# Patient Record
Sex: Female | Born: 1965 | Race: White | Hispanic: No | Marital: Married | Smoking: Current every day smoker
Health system: Southern US, Community
[De-identification: ages and names within clinical notes are randomized; demographics above are authoritative.]

## PROBLEM LIST (undated history)

## (undated) DIAGNOSIS — E559 Vitamin D deficiency, unspecified: Secondary | ICD-10-CM

## (undated) DIAGNOSIS — I10 Essential (primary) hypertension: Secondary | ICD-10-CM

## (undated) DIAGNOSIS — R7989 Other specified abnormal findings of blood chemistry: Secondary | ICD-10-CM

## (undated) DIAGNOSIS — F32A Depression, unspecified: Secondary | ICD-10-CM

## (undated) DIAGNOSIS — F419 Anxiety disorder, unspecified: Secondary | ICD-10-CM

## (undated) DIAGNOSIS — F329 Major depressive disorder, single episode, unspecified: Secondary | ICD-10-CM

## (undated) DIAGNOSIS — M75100 Unspecified rotator cuff tear or rupture of unspecified shoulder, not specified as traumatic: Secondary | ICD-10-CM

## (undated) DIAGNOSIS — R945 Abnormal results of liver function studies: Secondary | ICD-10-CM

## (undated) DIAGNOSIS — K219 Gastro-esophageal reflux disease without esophagitis: Secondary | ICD-10-CM

## (undated) DIAGNOSIS — I259 Chronic ischemic heart disease, unspecified: Secondary | ICD-10-CM

## (undated) DIAGNOSIS — M6530 Trigger finger, unspecified finger: Secondary | ICD-10-CM

## (undated) DIAGNOSIS — E785 Hyperlipidemia, unspecified: Secondary | ICD-10-CM

## (undated) DIAGNOSIS — J309 Allergic rhinitis, unspecified: Secondary | ICD-10-CM

## (undated) HISTORY — DX: Essential (primary) hypertension: I10

## (undated) HISTORY — DX: Anxiety disorder, unspecified: F41.9

## (undated) HISTORY — DX: Hyperlipidemia, unspecified: E78.5

## (undated) HISTORY — DX: Other specified abnormal findings of blood chemistry: R79.89

## (undated) HISTORY — DX: Allergic rhinitis, unspecified: J30.9

## (undated) HISTORY — DX: Unspecified rotator cuff tear or rupture of unspecified shoulder, not specified as traumatic: M75.100

## (undated) HISTORY — DX: Abnormal results of liver function studies: R94.5

## (undated) HISTORY — DX: Vitamin D deficiency, unspecified: E55.9

## (undated) HISTORY — DX: Depression, unspecified: F32.A

## (undated) HISTORY — DX: Chronic ischemic heart disease, unspecified: I25.9

## (undated) HISTORY — DX: Major depressive disorder, single episode, unspecified: F32.9

## (undated) HISTORY — PX: APPENDECTOMY: SHX54

## (undated) HISTORY — DX: Trigger finger, unspecified finger: M65.30

## (undated) HISTORY — DX: Gastro-esophageal reflux disease without esophagitis: K21.9

---

## 1998-05-07 ENCOUNTER — Other Ambulatory Visit: Admission: RE | Admit: 1998-05-07 | Discharge: 1998-05-07 | Payer: Self-pay | Admitting: Obstetrics & Gynecology

## 1999-12-09 ENCOUNTER — Other Ambulatory Visit: Admission: RE | Admit: 1999-12-09 | Discharge: 1999-12-09 | Payer: Self-pay | Admitting: Obstetrics & Gynecology

## 2002-11-22 ENCOUNTER — Other Ambulatory Visit: Admission: RE | Admit: 2002-11-22 | Discharge: 2002-11-22 | Payer: Self-pay | Admitting: Obstetrics & Gynecology

## 2003-12-01 ENCOUNTER — Other Ambulatory Visit: Admission: RE | Admit: 2003-12-01 | Discharge: 2003-12-01 | Payer: Self-pay | Admitting: Obstetrics & Gynecology

## 2004-03-26 ENCOUNTER — Ambulatory Visit (HOSPITAL_BASED_OUTPATIENT_CLINIC_OR_DEPARTMENT_OTHER): Admission: RE | Admit: 2004-03-26 | Discharge: 2004-03-26 | Payer: Self-pay | Admitting: Urology

## 2006-02-15 ENCOUNTER — Encounter: Admission: RE | Admit: 2006-02-15 | Discharge: 2006-02-15 | Payer: Self-pay | Admitting: Obstetrics & Gynecology

## 2016-01-08 ENCOUNTER — Encounter (INDEPENDENT_AMBULATORY_CARE_PROVIDER_SITE_OTHER): Payer: Self-pay

## 2016-01-08 ENCOUNTER — Encounter (INDEPENDENT_AMBULATORY_CARE_PROVIDER_SITE_OTHER): Payer: Self-pay | Admitting: Orthopaedic Surgery

## 2016-01-08 ENCOUNTER — Ambulatory Visit (INDEPENDENT_AMBULATORY_CARE_PROVIDER_SITE_OTHER): Payer: BC Managed Care – PPO | Admitting: Orthopaedic Surgery

## 2016-01-08 ENCOUNTER — Ambulatory Visit (INDEPENDENT_AMBULATORY_CARE_PROVIDER_SITE_OTHER): Payer: Self-pay

## 2016-01-08 VITALS — BP 167/82 | HR 69 | Ht 64.0 in | Wt 148.0 lb

## 2016-01-08 DIAGNOSIS — M65341 Trigger finger, right ring finger: Secondary | ICD-10-CM

## 2016-01-08 DIAGNOSIS — M25562 Pain in left knee: Secondary | ICD-10-CM

## 2016-01-08 DIAGNOSIS — G8929 Other chronic pain: Secondary | ICD-10-CM

## 2016-01-08 MED ORDER — LIDOCAINE HCL 1 % IJ SOLN
1.0000 mL | INTRAMUSCULAR | Status: AC | PRN
  Administered 2016-01-08: 1 mL

## 2016-01-08 MED ORDER — METHYLPREDNISOLONE ACETATE 40 MG/ML IJ SUSP
20.0000 mg | INTRAMUSCULAR | Status: AC | PRN
  Administered 2016-01-08: 20 mg via INTRA_ARTICULAR

## 2016-01-08 MED ORDER — LIDOCAINE HCL 1 % IJ SOLN
0.5000 mL | INTRAMUSCULAR | Status: AC | PRN
  Administered 2016-01-08: .5 mL

## 2016-01-08 MED ORDER — METHYLPREDNISOLONE ACETATE 40 MG/ML IJ SUSP
40.0000 mg | INTRAMUSCULAR | Status: AC | PRN
  Administered 2016-01-08: 40 mg via INTRA_ARTICULAR

## 2016-01-08 MED ORDER — BUPIVACAINE HCL 0.25 % IJ SOLN
0.6600 mL | INTRAMUSCULAR | Status: AC | PRN
  Administered 2016-01-08: .66 mL via INTRA_ARTICULAR

## 2016-01-08 NOTE — Progress Notes (Signed)
Office Visit Note   Patient: Kathryn Holmes           Date of Birth: 17-Feb-1965           MRN: [id number] Visit Date: 01/08/2016              Requested by: Lucianne LeiNina Uppin, MD [address] [place], [place] [id number] PCP: No primary care provider on file.   Assessment & Plan: Visit Diagnoses:  1. Acute pain of left knee   2. Chronic pain of left knee   3. Trigger finger, right ring finger   .  Plan: Patient had excellent relief for many months from previous injection we repeated the injection her left knee with cortisone to help with the mild synovitis most for symptoms are medial. The right ring finger triggering was treated with the A1 pulley injection. If she has persistent triggering with this she will call and we can set her up for trigger finger release as an outpatient surgery.  Follow-Up Instructions: No Follow-up on file.   Orders:  Orders Placed This Encounter  Procedures  . Large Joint Injection/Arthrocentesis  . Small Joint Injection/Arthrocentesis  . XR Knee 1-2 Views Left   No orders of the defined types were placed in this encounter.     Procedures: Large Joint Inj Date/Time: 01/08/2016 2:52 PM Performed by: Eldred MangesYATES, MARK C Authorized by: Annell GreeningYATES, MARK C   Consent Given by:  Patient Indications:  Pain and joint swelling Location:  Knee Site:  L knee Needle Size:  25 G Needle Length:  1.5 inches Approach:  Anterolateral Ultrasound Guidance: No   Fluoroscopic Guidance: No   Arthrogram: No   Medications:  1 mL lidocaine 1 %; 40 mg methylPREDNISolone acetate 40 MG/ML; 0.66 mL bupivacaine 0.25 % Aspiration Attempted: No   Patient tolerance:  Patient tolerated the procedure well with no immediate complications Small Joint Inj Date/Time: 01/08/2016 2:53 PM Performed by: Eldred MangesYATES, MARK C Authorized by: Annell GreeningYATES, MARK C   Consent Given by:  Patient Indications:  Pain Needle Size:  25 G Spinal Needle: No   Approach:  Volar Ultrasound Guided: No     Fluoroscopic Guidance: No   Medications:  20 mg methylPREDNISolone acetate 40 MG/ML; 0.5 mL lidocaine 1 % Patient tolerance:  Patient tolerated the procedure well with no immediate complications     Clinical Data: No additional findings.   Subjective: Chief Complaint  Patient presents with  . Right Hand - Pain  . Left Knee - Pain    Patient states right ring finger pain for about 3 months, Pain radiates into right hand.  Had some swelling in right ring finger and soreness.  Left knee pain for about 1 year, states she bent down at work and felt a pop.  Seen PCP, had x-rays taken, pops and gives out at times when walking.  Patient states her left knee rigidly she has pain down and And suddenly her knee popped and she setting persistent pain since then she had an injection done by her primary care physician into her knee in about April of this year. She states she was better for. Time and then after about 6 months she started having increasing pain in her knee and catching once again. States her knee actually gives way sometimes when she is walking. Going up or down steps or an area with particularly bothers her.  Review of Systems  Constitutional: Negative for chills and diaphoresis.  HENT: Negative for ear discharge,  ear pain and nosebleeds.   Eyes: Negative for discharge and visual disturbance.  Respiratory: Negative for cough, choking and shortness of breath.        Patient is a smoker  Cardiovascular: Negative for chest pain and palpitations.       History of cardiomyopathy, patient states she was told is related to stress  Gastrointestinal: Negative for abdominal distention and abdominal pain.  Endocrine: Negative for cold intolerance and heat intolerance.  Genitourinary: Negative for flank pain and hematuria.  Skin: Negative for rash and wound.  Neurological: Negative for seizures and speech difficulty.  Hematological: Negative for adenopathy. Does not bruise/bleed easily.   Psychiatric/Behavioral: Negative for agitation and suicidal ideas.     Objective: Vital Signs: BP (!) 167/82   Pulse 69   Ht 5\' 4"  (1.626 m)   Wt 148 lb (67.1 kg)   BMI 25.40 kg/m   Physical Exam  Constitutional: She is oriented to person, place, and time. She appears well-developed.  HENT:  Head: Normocephalic.  Right Ear: External ear normal.  Left Ear: External ear normal.  Eyes: Pupils are equal, round, and reactive to light.  Neck: No tracheal deviation present. No thyromegaly present.  Cardiovascular: Normal rate.   Pulmonary/Chest: Effort normal.  Abdominal: Soft.  Musculoskeletal:  Patient has pain with patellar compression difficulty reaching full extension with squatting she has sharp pain with a pop in her knee. Opposite right knee also has some popping with the resisted extension. Collateral ligaments are stable mild synovitis left knee. Positive patellofemoral grind test medial joint line tenderness anterior cruciate ligament PCL exam is normal negative Lachman and negative pivot shift distal pulses are 2+.  Neurological: She is alert and oriented to person, place, and time.  Skin: Skin is warm and dry.  Psychiatric: She has a normal mood and affect. Her behavior is normal.    Ortho Exam Patient has acute triggering with palpable nodule flexor tendon at the A1 pulley right ring finger. No other fingers are triggering. Joints of the wrist and digits are normal. Specialty Comments:  No specialty comments available.  Imaging: Xr Knee 1-2 Views Left  Result Date: 01/08/2016 Standing AP x-ray and lateral left knee obtained. This shows mild irregularity on the patellar articular surface. No joint space narrowing in standing position.    PMFS History: There are no active problems to display for this patient.  No past medical history on file.  No family history on file.  No past surgical history on file. Social History   Occupational History  . Not on file.    Social History Main Topics  . Smoking status: Current Every Day Smoker  . Smokeless tobacco: Never Used  . Alcohol use Not on file  . Drug use: Unknown  . Sexual activity: Not on file

## 2016-02-10 ENCOUNTER — Ambulatory Visit (INDEPENDENT_AMBULATORY_CARE_PROVIDER_SITE_OTHER): Payer: BC Managed Care – PPO | Admitting: Orthopaedic Surgery

## 2016-03-15 ENCOUNTER — Ambulatory Visit (INDEPENDENT_AMBULATORY_CARE_PROVIDER_SITE_OTHER): Payer: BC Managed Care – PPO | Admitting: Orthopaedic Surgery

## 2016-03-16 ENCOUNTER — Ambulatory Visit (INDEPENDENT_AMBULATORY_CARE_PROVIDER_SITE_OTHER): Payer: BC Managed Care – PPO | Admitting: Orthopaedic Surgery

## 2016-03-16 ENCOUNTER — Encounter (INDEPENDENT_AMBULATORY_CARE_PROVIDER_SITE_OTHER): Payer: Self-pay | Admitting: Orthopaedic Surgery

## 2016-03-16 VITALS — BP 154/81 | HR 59 | Ht 63.0 in | Wt 147.0 lb

## 2016-03-16 DIAGNOSIS — M25562 Pain in left knee: Secondary | ICD-10-CM

## 2016-03-16 DIAGNOSIS — G8929 Other chronic pain: Secondary | ICD-10-CM | POA: Diagnosis not present

## 2016-03-16 NOTE — Progress Notes (Signed)
   Office Visit Note   Patient: Ashok CordiaSandra C Hitson           Date of Birth: [birth year]           MRN: [id number] Visit Date: 03/16/2016              Requested by: No referring provider defined for this encounter. PCP: No primary care provider on file.   Assessment & Plan: Visit Diagnoses:  1. Chronic pain of left knee   and mechanical symptoms  Plan: For ongoing left knee pain and mechanical symptoms we'll schedule MRI to rule out medial meniscus tear.  Follow-up after completion to discuss results. Failed conservative treatment with injections, oral NSAIDs, home exercise program.    Follow-Up Instructions: Return in about 3 weeks (around 04/06/2016) for Return for review of left knee MRI scan.   Orders:  Orders Placed This Encounter  Procedures  . MR Knee Left w/o contrast   No orders of the defined types were placed in this encounter.     Procedures: No procedures performed   Clinical Data: No additional findings.   Subjective: Chief Complaint  Patient presents with  . Left Knee - Pain    Patient returns with left knee pain. She had a left knee injection on 01/08/2016. She states that the injection only lasted a few days. She states the knee is uncomfortable to walk or apply pressure. The pain is waking her up at night. She states that it "feels like something is electrocuting in there".  She also had right trigger finger injection on 01/08/2016.  She states the finger is better.   She describes ongoing medial knee pain with feeling of locking and catching.  recent injection  helped for a couple days.  Review of Systems  Respiratory: Negative.   Cardiovascular: Negative.   Musculoskeletal:       Left knee pain  Neurological: Negative.   Psychiatric/Behavioral: Negative.      Objective: Vital Signs: BP (!) 154/81   Pulse (!) 59   Ht 5\' 3"  (1.6 m)   Wt 147 lb (66.7 kg)   BMI 26.04 kg/m   Physical Exam  Constitutional: She is oriented to person, place,  and time. She appears well-developed.  HENT:  Head: Normocephalic.  Pulmonary/Chest: No respiratory distress.  Musculoskeletal: Normal range of motion.  Left knee she has good range of motion. No palpable effusion. Medial joint line tender. Positive McMurray's test. Cruciate and collateral ligament was stable.  Neurological: She is alert and oriented to person, place, and time.  Skin: Skin is warm.  Psychiatric: She has a normal mood and affect.    Ortho Exam  Specialty Comments:  No specialty comments available.  Imaging: No results found.   PMFS History: There are no active problems to display for this patient.  No past medical history on file.  No family history on file.  No past surgical history on file. Social History   Occupational History  . Not on file.   Social History Main Topics  . Smoking status: Current Every Day Smoker  . Smokeless tobacco: Never Used  . Alcohol use Not on file  . Drug use: Unknown  . Sexual activity: Not on file

## 2016-03-18 ENCOUNTER — Telehealth (INDEPENDENT_AMBULATORY_CARE_PROVIDER_SITE_OTHER): Payer: Self-pay | Admitting: *Deleted

## 2016-03-18 NOTE — Telephone Encounter (Signed)
Pt called back and aware of appt 

## 2016-03-18 NOTE — Telephone Encounter (Signed)
[facility] at 5[facility] for MRI, pt needs to arrive 15 mins early for appt, Lmtrc to pt for appt information.

## 2016-03-22 ENCOUNTER — Ambulatory Visit (HOSPITAL_COMMUNITY)
Admission: RE | Admit: 2016-03-22 | Discharge: 2016-03-22 | Disposition: A | Discharge status: Home / Self Care | Payer: BC Managed Care – PPO | Source: Ambulatory Visit | Attending: Surgery | Admitting: Surgery

## 2016-03-22 ENCOUNTER — Encounter (HOSPITAL_COMMUNITY): Payer: Self-pay | Admitting: Radiology

## 2016-03-22 DIAGNOSIS — M25562 Pain in left knee: Secondary | ICD-10-CM | POA: Insufficient documentation

## 2016-03-22 DIAGNOSIS — G8929 Other chronic pain: Secondary | ICD-10-CM | POA: Diagnosis present

## 2016-03-22 DIAGNOSIS — M25462 Effusion, left knee: Secondary | ICD-10-CM | POA: Insufficient documentation

## 2016-04-19 ENCOUNTER — Encounter (INDEPENDENT_AMBULATORY_CARE_PROVIDER_SITE_OTHER): Payer: Self-pay | Admitting: Orthopaedic Surgery

## 2016-04-19 ENCOUNTER — Ambulatory Visit (INDEPENDENT_AMBULATORY_CARE_PROVIDER_SITE_OTHER): Payer: BC Managed Care – PPO | Admitting: Orthopaedic Surgery

## 2016-04-19 VITALS — BP 146/82 | HR 70 | Ht 63.0 in | Wt 147.0 lb

## 2016-04-19 DIAGNOSIS — M7542 Impingement syndrome of left shoulder: Secondary | ICD-10-CM | POA: Diagnosis not present

## 2016-04-19 DIAGNOSIS — M23204 Derangement of unspecified medial meniscus due to old tear or injury, left knee: Secondary | ICD-10-CM | POA: Diagnosis not present

## 2016-04-19 NOTE — Progress Notes (Signed)
Office Visit Note   Patient: Kathryn CordiaSandra C Schembri           Date of Birth: 01/27/1965           MRN: [id number] Visit Date: 04/19/2016              Requested by: No referring provider defined for this encounter. PCP: Lucianne LeiUPPIN,NINA, MD   Assessment & Plan: Visit Diagnoses:  1. Impingement syndrome of left shoulder   2. Degenerative tear of medial meniscus of left knee     Plan: MRI scan shows a degenerative posterior medial meniscus. She has no symptoms consistent with iliotibial band where she also has a little bit of edema. This may be related from previous cortisone injection she had an anemia at the lateral side. The posterior medial meniscus shows some degeneration but no definite tear. She can continues to have ongoing problems with her knee will set up for some physical therapy as well as for her left shoulder which bothers her  Follow-Up Instructions: Return in about 1 month (around 05/20/2016).   Orders:  No orders of the defined types were placed in this encounter.  No orders of the defined types were placed in this encounter.     Procedures: No procedures performed   Clinical Data: No additional findings.   Subjective: Chief Complaint  Patient presents with  . Left Knee - Pain, Follow-up    Patient returns to review MRI left knee. She states there have been no changes. She continues to have left knee pain.    Review of Systems 14 point review of systems updated unchanged from 03/16/2016 office visit.  Objective: Vital Signs: BP (!) 146/82   Pulse 70   Ht 5\' 3"  (1.6 m)   Wt 147 lb (66.7 kg)   BMI 26.04 kg/m   Physical Exam  Constitutional: She is oriented to person, place, and time. She appears well-developed.  HENT:  Head: Normocephalic.  Right Ear: External ear normal.  Left Ear: External ear normal.  Eyes: Pupils are equal, round, and reactive to light.  Neck: No tracheal deviation present. No thyromegaly present.  Cardiovascular: Normal rate.     Pulmonary/Chest: Effort normal.  Abdominal: Soft.  Musculoskeletal:  Patient has discomfort with the outstretched reaching left arm. Positive impingement left arm negative drop arm test but she has some discomfort with empty can test and resisted supraspinatus testing lung of the biceps is normal negative ureter sent. No by complex tenderness. Reflexes are 2+ and symmetrical. No knee effusion noted. No tenderness laterally iliotibial band is normal collateral crucial ligament exam is normal. She is tender along the medial joint line left knee primarily posteriorly. No clicking or Baker's cyst tib-fib joint is normal. No synovitis of noted in the upper extremities. Knees reach full extension negative straight-leg raising. Mild sciatic notch tenderness on the left side and none on the right. Trochanteric bursa is nontender.  Neurological: She is alert and oriented to person, place, and time.  Skin: Skin is warm and dry.  Psychiatric: She has a normal mood and affect. Her behavior is normal.    Ortho Exam  Specialty Comments:  No specialty comments available.  Imaging: No results found.   PMFS History: There are no active problems to display for this patient.  No past medical history on file.  No family history on file.  No past surgical history on file. Social History   Occupational History  . Not on file.   Social History Main  Topics  . Smoking status: Current Every Day Smoker  . Smokeless tobacco: Never Used  . Alcohol use Not on file  . Drug use: Unknown  . Sexual activity: Not on file

## 2016-05-24 ENCOUNTER — Ambulatory Visit (INDEPENDENT_AMBULATORY_CARE_PROVIDER_SITE_OTHER): Payer: BC Managed Care – PPO | Admitting: Orthopaedic Surgery

## 2016-11-23 ENCOUNTER — Ambulatory Visit: Payer: BC Managed Care – PPO | Admitting: Cardiology

## 2016-12-14 ENCOUNTER — Ambulatory Visit: Payer: BC Managed Care – PPO | Admitting: Cardiology

## 2016-12-23 ENCOUNTER — Encounter: Payer: Self-pay | Admitting: Cardiology

## 2016-12-23 ENCOUNTER — Encounter: Payer: Self-pay | Admitting: *Deleted

## 2016-12-23 ENCOUNTER — Other Ambulatory Visit: Payer: Self-pay | Admitting: *Deleted

## 2016-12-23 DIAGNOSIS — R7989 Other specified abnormal findings of blood chemistry: Secondary | ICD-10-CM | POA: Insufficient documentation

## 2016-12-23 DIAGNOSIS — E785 Hyperlipidemia, unspecified: Secondary | ICD-10-CM

## 2016-12-23 DIAGNOSIS — R945 Abnormal results of liver function studies: Secondary | ICD-10-CM

## 2016-12-23 DIAGNOSIS — I10 Essential (primary) hypertension: Secondary | ICD-10-CM | POA: Insufficient documentation

## 2016-12-23 DIAGNOSIS — I259 Chronic ischemic heart disease, unspecified: Secondary | ICD-10-CM | POA: Insufficient documentation

## 2016-12-23 DIAGNOSIS — E559 Vitamin D deficiency, unspecified: Secondary | ICD-10-CM | POA: Insufficient documentation

## 2016-12-23 DIAGNOSIS — K219 Gastro-esophageal reflux disease without esophagitis: Secondary | ICD-10-CM | POA: Insufficient documentation

## 2016-12-23 HISTORY — DX: Hyperlipidemia, unspecified: E78.5

## 2016-12-23 HISTORY — DX: Gastro-esophageal reflux disease without esophagitis: K21.9

## 2016-12-23 HISTORY — DX: Vitamin D deficiency, unspecified: E55.9

## 2016-12-26 ENCOUNTER — Ambulatory Visit (INDEPENDENT_AMBULATORY_CARE_PROVIDER_SITE_OTHER): Payer: Worker's Compensation | Admitting: Cardiology

## 2016-12-26 ENCOUNTER — Encounter (INDEPENDENT_AMBULATORY_CARE_PROVIDER_SITE_OTHER): Payer: Self-pay

## 2016-12-26 ENCOUNTER — Encounter: Payer: Self-pay | Admitting: Cardiology

## 2016-12-26 VITALS — BP 140/80 | HR 57 | Ht 63.0 in | Wt 156.0 lb

## 2016-12-26 DIAGNOSIS — I10 Essential (primary) hypertension: Secondary | ICD-10-CM | POA: Insufficient documentation

## 2016-12-26 DIAGNOSIS — F17210 Nicotine dependence, cigarettes, uncomplicated: Secondary | ICD-10-CM

## 2016-12-26 DIAGNOSIS — E782 Mixed hyperlipidemia: Secondary | ICD-10-CM | POA: Diagnosis not present

## 2016-12-26 DIAGNOSIS — Z8679 Personal history of other diseases of the circulatory system: Secondary | ICD-10-CM

## 2016-12-26 DIAGNOSIS — Z01810 Encounter for preprocedural cardiovascular examination: Secondary | ICD-10-CM | POA: Diagnosis not present

## 2016-12-26 HISTORY — DX: Essential (primary) hypertension: I10

## 2016-12-26 HISTORY — DX: Encounter for preprocedural cardiovascular examination: Z01.810

## 2016-12-26 HISTORY — DX: Nicotine dependence, cigarettes, uncomplicated: F17.210

## 2016-12-26 HISTORY — DX: Personal history of other diseases of the circulatory system: Z86.79

## 2016-12-26 NOTE — Addendum Note (Signed)
Addended by: Craige CottaANDERSON, Ellasyn Swilling S on: 12/26/2016 09:45 AM   Modules accepted: Orders

## 2016-12-26 NOTE — Progress Notes (Signed)
Cardiology Office Note:    Date:  12/26/2016   ID:  Kathryn Holmes, DOB 12-26-1965, MRN [id number]  PCP:  Lucianne LeiUppin, Nina, MD  Cardiologist:  Garwin Brothersajan R Jenavi Beedle, MD   Referring MD: Lucianne LeiUppin, Nina, MD    ASSESSMENT:    1. Preoperative cardiovascular examination   2. Essential hypertension   3. Mixed hyperlipidemia   4. History of cardiomyopathy   5. Continuous dependence on cigarette smoking    PLAN:    In order of problems listed above:  1. Secondary prevention stressed to the patient.  Importance of compliance with diet and medication stressed and she vocalized understanding. 2. Her blood pressure is stable and her lipids are followed by her primary care physician. 3. She leads a sedentary lifestyle.  She has multiple risk factors for coronary artery disease.  She will undergo echocardiogram testing to assess left ventricular systolic function in view of cardiomyopathy.  Also she has multiple risk factors for coronary artery disease so we will do a Lexiscan sestamibi.  If this is negative then she has not at high risk for coronary events during the aforementioned surgery.  Meticulous hemodynamic monitoring and continued perioperative beta-blockade will further reduce the risk of coronary events.  I spent 5 minutes with the patient discussing solely about smoking. Smoking cessation was counseled. I suggested to the patient also different medications and pharmacological interventions. Patient is keen to try stopping on its own at this time. He will get back to me if he needs any further assistance in this matter.   Medication Adjustments/Labs and Tests Ordered: Current medicines are reviewed at length with the patient today.  Concerns regarding medicines are outlined above.  No orders of the defined types were placed in this encounter.  No orders of the defined types were placed in this encounter.    History of Present Illness:    Kathryn Holmes is a 51 y.o. female who is being seen  today for the evaluation of preop cardiovascular risk stratification at the request of Lucianne LeiUppin, Nina, MD.  Patient is a pleasant 51 year old female.  This patient has been under my care in my previous practice.  He is here now to transfer his care and be established with my current practice.  She has past medical history of essential hypertension and dyslipidemia.  She mentions to me that about 5 years ago she has undergone coronary angiography and was told that her coronaries were unremarkable.  At that time she had history of cardiomyopathy.  Subsequently in her history she mentions to me that last year she [facility] and underwent stress testing which was unremarkable.  She leads a sedentary lifestyle.  She works as a Arboriculturistcustodian.  She denies any chest pain but has shortness of breath.  No orthopnea or PND.  At the time of my evaluation she is alert awake oriented and in no distress.  She denies any history of syncope.  Her shortness of breath is fairly steady.  Unfortunately she continues to smoke and is considering quitting by the end of the.  Past Medical History:  Diagnosis Date  . Allergic rhinitis   . Anxiety and depression   . Elevated LFTs   . GERD (gastroesophageal reflux disease) 12/23/2016  . Hyperlipidemia 12/23/2016  . Hypertension   . Ischemic heart disease   . Rotator cuff tear    Left shoulder  . Trigger finger   . Vitamin D deficiency 12/23/2016    Past Surgical History:  Procedure Laterality Date  . APPENDECTOMY      Current Medications: Current Meds  Medication Sig  . aspirin EC 81 MG tablet Take 81 mg by mouth daily.  Marland Kitchen. atorvastatin (LIPITOR) 80 MG tablet   . buPROPion (WELLBUTRIN XL) 150 MG 24 hr tablet   . fluticasone (FLONASE) 50 MCG/ACT nasal spray   . lisinopril-hydrochlorothiazide (PRINZIDE,ZESTORETIC) 10-12.5 MG tablet   . metoprolol succinate (TOPROL-XL) 50 MG 24 hr tablet daily.  . montelukast (SINGULAIR) 10 MG tablet   . MYRBETRIQ 50  MG TB24 tablet daily.  . nitroGLYCERIN (NITROSTAT) 0.4 MG SL tablet Place 0.4 mg under the tongue every 5 (five) minutes as needed for chest pain.  . Omega-3 Fatty Acids (FISH OIL) 1000 MG CAPS Take 1,000 mg by mouth 2 (two) times daily.  Marland Kitchen. omeprazole (PRILOSEC) 40 MG capsule   . PROAIR HFA 108 (90 Base) MCG/ACT inhaler   . sertraline (ZOLOFT) 100 MG tablet   . Vitamin D, Ergocalciferol, (DRISDOL) 50000 units CAPS capsule      Allergies:   Patient has no known allergies.   Social History   Socioeconomic History  . Marital status: Single    Spouse name: None  . Number of children: None  . Years of education: None  . Highest education level: None  Social Needs  . Financial resource strain: None  . Food insecurity - worry: None  . Food insecurity - inability: None  . Transportation needs - medical: None  . Transportation needs - non-medical: None  Occupational History  . None  Tobacco Use  . Smoking status: Current Every Day Smoker  . Smokeless tobacco: Never Used  Substance and Sexual Activity  . Alcohol use: No    Frequency: Never  . Drug use: No  . Sexual activity: None  Other Topics Concern  . None  Social History Narrative  . None     Family History: The patient's family history includes Heart attack in her father; Hypercholesterolemia in her father; Hypertension in her brother, mother, and sister.  ROS:   Please see the history of present illness.    All other systems reviewed and are negative.  EKGs/Labs/Other Studies Reviewed:    The following studies were reviewed today: I discussed the findings of lab work and office records sent by the primary care physician at length   Recent Labs: No results found for requested labs within last 8760 hours.  Recent Lipid Panel No results found for: CHOL, TRIG, HDL, CHOLHDL, VLDL, LDLCALC, LDLDIRECT  Physical Exam:    VS:  BP 140/80 (BP Location: Right Arm, Patient Position: Sitting, Cuff Size: Normal)   Pulse (!)  57   Ht 5\' 3"  (1.6 m)   Wt 156 lb (70.8 kg)   SpO2 98%   BMI 27.63 kg/m     Wt Readings from Last 3 Encounters:  12/26/16 156 lb (70.8 kg)  04/19/16 147 lb (66.7 kg)  03/16/16 147 lb (66.7 kg)     GEN: Patient is in no acute distress HEENT: Normal NECK: No JVD; No carotid bruits LYMPHATICS: No lymphadenopathy CARDIAC: S1 S2 regular, 2/6 systolic murmur at the apex. RESPIRATORY:  Clear to auscultation without rales, wheezing or rhonchi  ABDOMEN: Soft, non-tender, non-distended MUSCULOSKELETAL:  No edema; No deformity  SKIN: Warm and dry NEUROLOGIC:  Alert and oriented x 3 PSYCHIATRIC:  Normal affect    Signed, Garwin Brothersajan R Cannie Muckle, MD  12/26/2016 9:37 AM    [facility]

## 2016-12-26 NOTE — Patient Instructions (Signed)
Medication Instructions:  Your physician recommends that you continue on your current medications as directed. Please refer to the Current Medication list given to you today.  Labwork: None  Testing/Procedures: Your physician has requested that you have an echocardiogram. Echocardiography is a painless test that uses sound waves to create images of your heart. It provides your doctor with information about the size and shape of your heart and how well your heart's chambers and valves are working. This procedure takes approximately one hour. There are no restrictions for this procedure.  Your physician has requested that you have a lexiscan myoview. For further information please visit [email or web address]. Please follow instruction sheet, as given.   Follow-Up: Your physician recommends that you schedule a follow-up appointment in: 3 months   Any Other Special Instructions Will Be Listed Below (If Applicable).     If you need a refill on your cardiac medications before your next appointment, please call your pharmacy.   [facility]  Garey HamAshley A, RN, BSN

## 2017-02-20 ENCOUNTER — Telehealth: Payer: Self-pay | Admitting: Cardiology

## 2017-02-20 NOTE — Telephone Encounter (Signed)
About an appt she's supposed to be set up for

## 2017-02-20 NOTE — Telephone Encounter (Signed)
Patient was inquiring if we had received any workers comp papers; as of late I have not.

## 2017-02-28 ENCOUNTER — Telehealth: Payer: Self-pay | Admitting: Cardiology

## 2017-02-28 NOTE — Telephone Encounter (Signed)
Informed patient that the documents had been received and that Bjorn LoserRhonda will be receiving the information soon.

## 2017-02-28 NOTE — Telephone Encounter (Signed)
Patient wants to know if work comp has sent in paperwork to doctor

## 2017-03-06 ENCOUNTER — Telehealth: Payer: Self-pay | Admitting: Cardiology

## 2017-03-06 NOTE — Telephone Encounter (Signed)
Will discuss with Charmaine to see if this has been resolved with pre-certing.

## 2017-03-06 NOTE — Telephone Encounter (Signed)
Has questions about something that is supposed to be scheduled for her

## 2017-03-10 ENCOUNTER — Other Ambulatory Visit: Payer: Self-pay

## 2017-03-10 DIAGNOSIS — Z01818 Encounter for other preprocedural examination: Secondary | ICD-10-CM

## 2017-03-10 NOTE — Telephone Encounter (Signed)
Informed patient of her appointments date and time.

## 2017-03-14 ENCOUNTER — Telehealth (HOSPITAL_COMMUNITY): Payer: Self-pay | Admitting: *Deleted

## 2017-03-14 NOTE — Telephone Encounter (Signed)
Left message on voicemail per DPR in reference to upcoming appointment scheduled on 03/17/17 at 1100 with detailed instructions given per Myocardial Perfusion Study Information Sheet for the test. LM to arrive 15 minutes early, and that it is imperative to arrive on time for appointment to keep from having the test rescheduled. If you need to cancel or reschedule your appointment, please call the office within 24 hours of your appointment. Failure to do so may result in a cancellation of your appointment, and a $50 no show fee. Phone number given for call back for any questions.

## 2017-03-17 ENCOUNTER — Encounter (INDEPENDENT_AMBULATORY_CARE_PROVIDER_SITE_OTHER): Payer: Self-pay

## 2017-03-17 ENCOUNTER — Other Ambulatory Visit: Payer: Self-pay

## 2017-03-17 ENCOUNTER — Ambulatory Visit (HOSPITAL_COMMUNITY): Payer: No Typology Code available for payment source | Attending: Cardiovascular Disease

## 2017-03-17 ENCOUNTER — Ambulatory Visit (HOSPITAL_COMMUNITY): Payer: Worker's Compensation | Attending: Cardiology

## 2017-03-17 DIAGNOSIS — E785 Hyperlipidemia, unspecified: Secondary | ICD-10-CM | POA: Diagnosis not present

## 2017-03-17 DIAGNOSIS — I517 Cardiomegaly: Secondary | ICD-10-CM | POA: Insufficient documentation

## 2017-03-17 DIAGNOSIS — I10 Essential (primary) hypertension: Secondary | ICD-10-CM | POA: Diagnosis not present

## 2017-03-17 DIAGNOSIS — Z01818 Encounter for other preprocedural examination: Secondary | ICD-10-CM | POA: Insufficient documentation

## 2017-03-17 LAB — MYOCARDIAL PERFUSION IMAGING
CHL CUP NUCLEAR SDS: 3
CHL CUP NUCLEAR SRS: 0
CHL CUP NUCLEAR SSS: 3
CHL CUP RESTING HR STRESS: 64 {beats}/min
LHR: 0.4
LV dias vol: 84 mL (ref 46–106)
LV sys vol: 20 mL
NUC STRESS TID: 1.12
Peak HR: 90 {beats}/min

## 2017-03-17 MED ORDER — TECHNETIUM TC 99M TETROFOSMIN IV KIT
32.4000 | PACK | Freq: Once | INTRAVENOUS | Status: AC | PRN
  Administered 2017-03-17: 32.4 via INTRAVENOUS
  Filled 2017-03-17: qty 33

## 2017-03-17 MED ORDER — TECHNETIUM TC 99M TETROFOSMIN IV KIT
10.1000 | PACK | Freq: Once | INTRAVENOUS | Status: AC | PRN
  Administered 2017-03-17: 10.1 via INTRAVENOUS
  Filled 2017-03-17: qty 11

## 2017-03-17 MED ORDER — REGADENOSON 0.4 MG/5ML IV SOLN
0.4000 mg | Freq: Once | INTRAVENOUS | Status: AC
  Administered 2017-03-17: 0.4 mg via INTRAVENOUS

## 2017-04-20 DIAGNOSIS — Z01818 Encounter for other preprocedural examination: Secondary | ICD-10-CM | POA: Diagnosis not present

## 2017-09-02 IMAGING — MR MR KNEE*L* W/O CM
4 of 6 series · 19 of 40 positions shown · non-contrast
Comparison: 07/15/2014

CLINICAL DATA: Chronic left knee pain.  Swelling.

EXAM:
MRI OF THE LEFT KNEE WITHOUT CONTRAST
TECHNIQUE: Multiplanar, multisequence MR imaging of the knee was performed. No
intravenous contrast was administered.

[Series 4: PD fat-sat · axial · 4.0mm · 0.29mm/px · z∈[-113,+6]mm · 8 of 25 slices shown (1 of 4)]
[im 1/25]
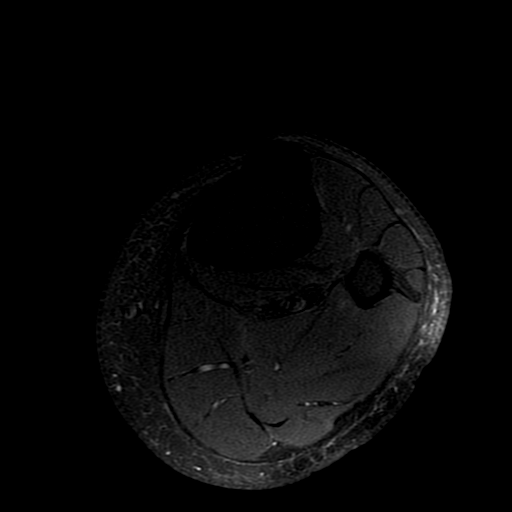
[im 4/25]
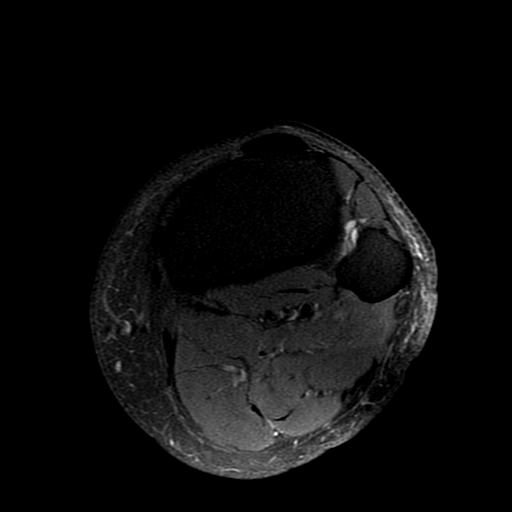
[im 7/25]
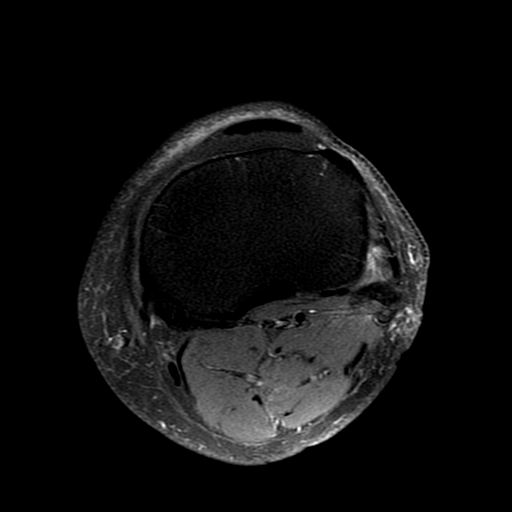
[im 11/25]
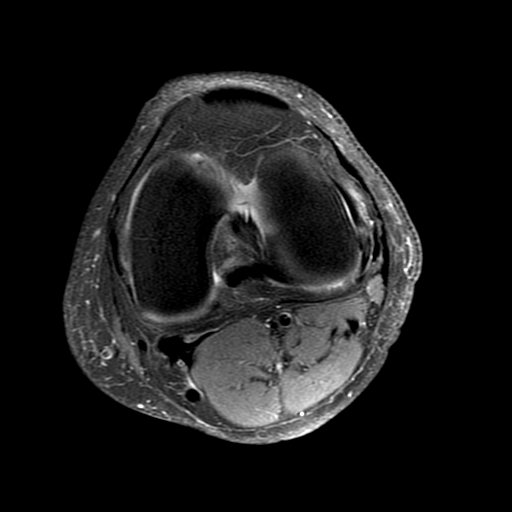
[im 14/25]
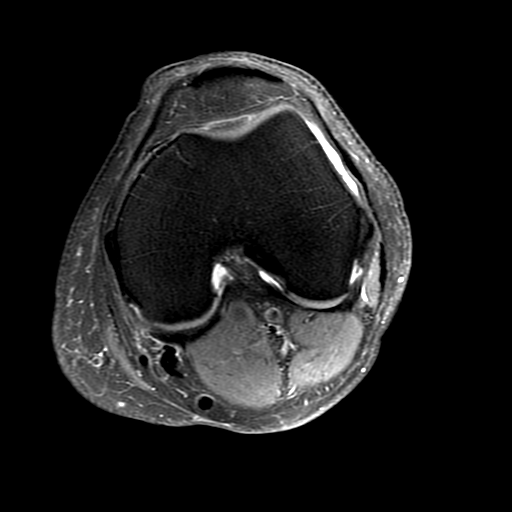
[im 18/25]
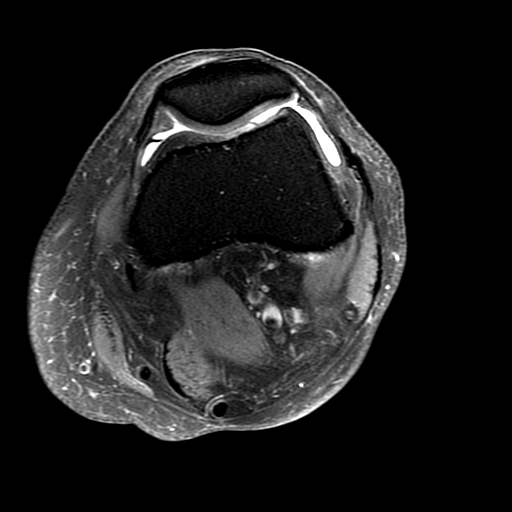
[im 21/25]
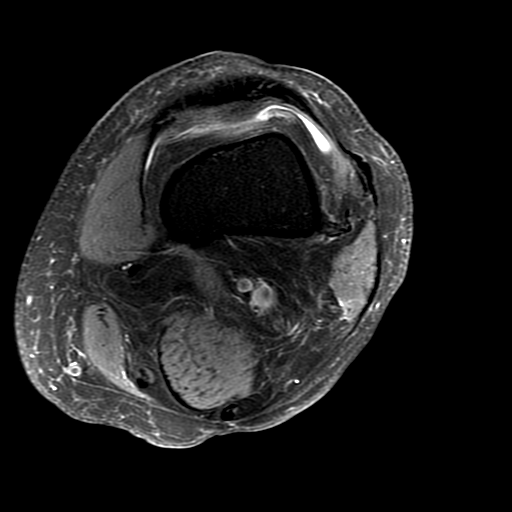
[im 25/25]
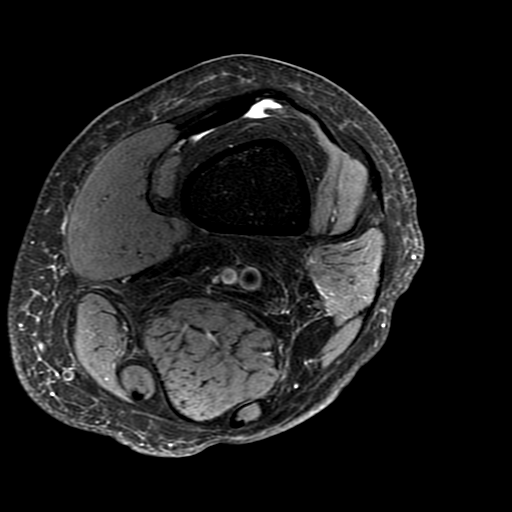

[Series 5: PD fat-sat · coronal · 4.0mm · 0.31mm/px · 5 of 23 slices shown (2 of 4)]
[im 1/23]
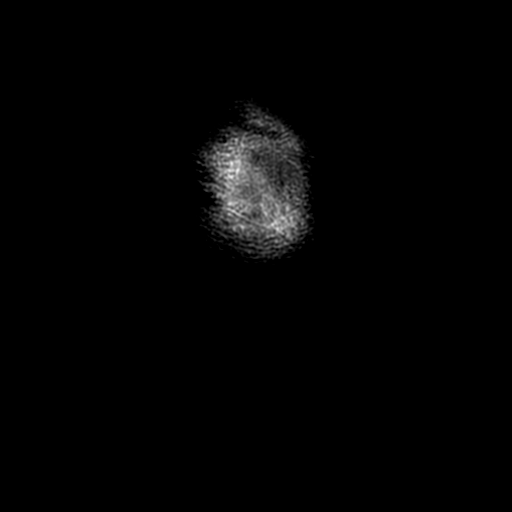
[im 4/23]
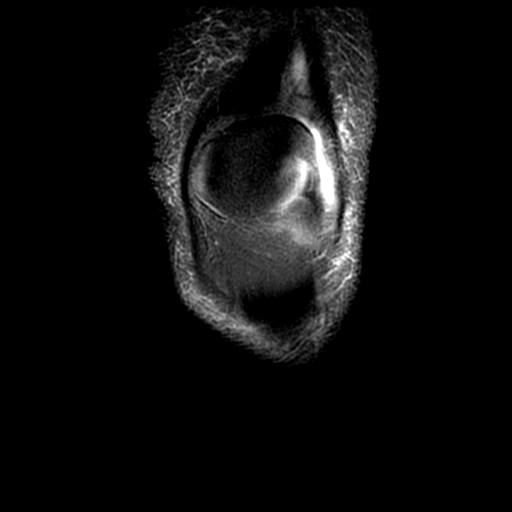
[im 8/23]
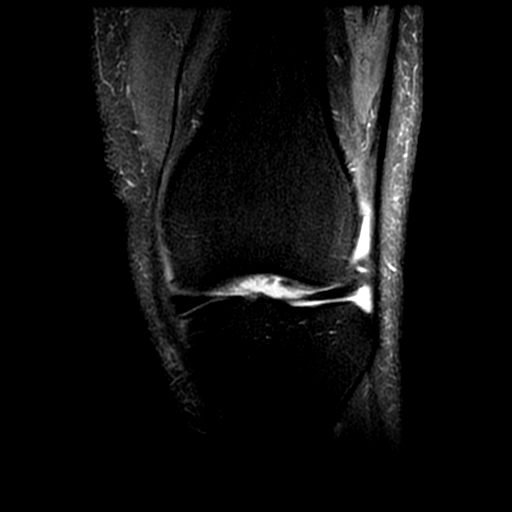
[im 12/23]
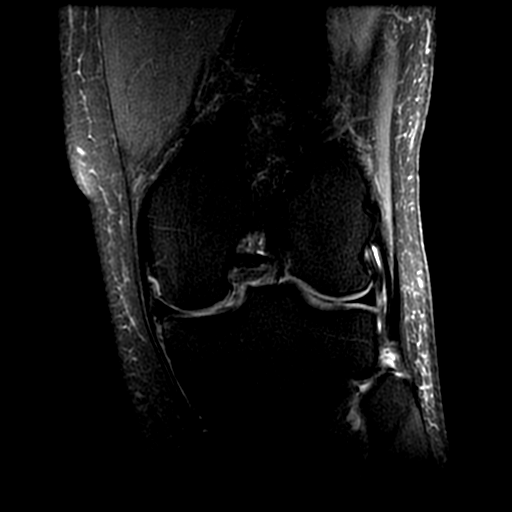
[im 19/23]
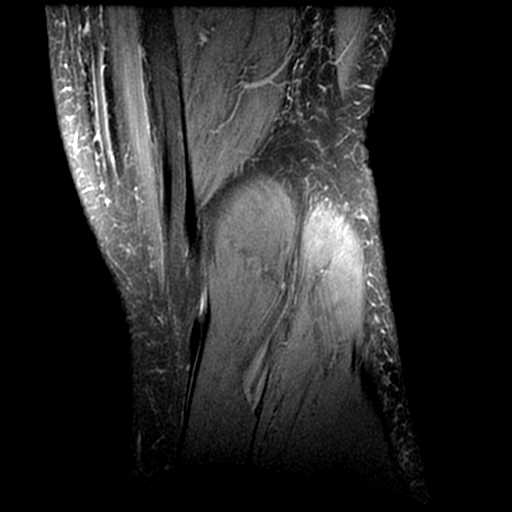

[Series 7: PD fat-sat · sagittal · 4.0mm · 0.31mm/px · 3 of 22 slices shown (3 of 4)]
[im 4/22]
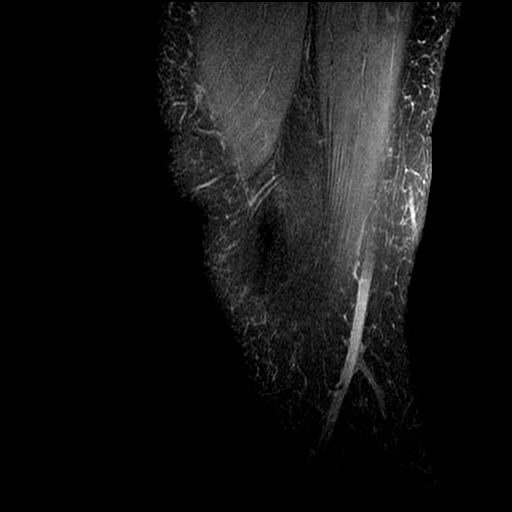
[im 11/22]
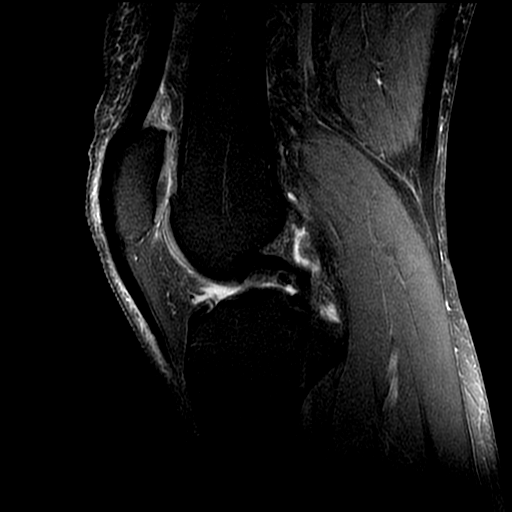
[im 18/22]
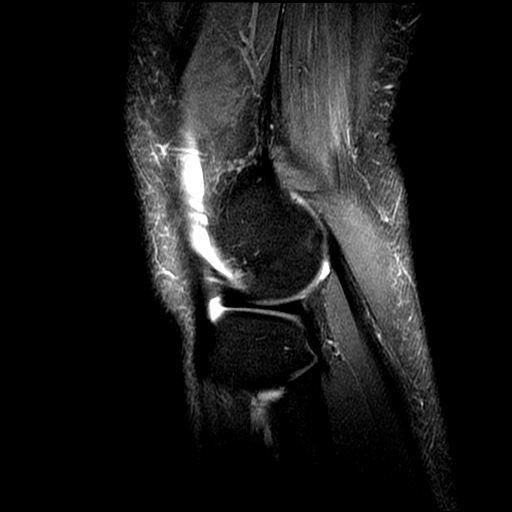

[Series 9: PD fat-sat · coronal · 2.0mm · 0.31mm/px · 3 of 12 slices shown (4 of 4)]
[im 1/12]
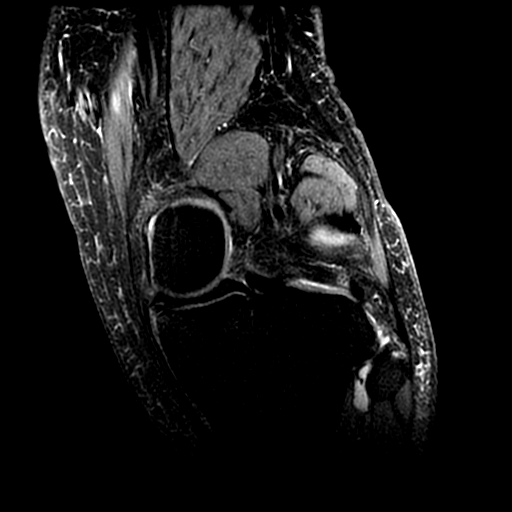
[im 8/12]
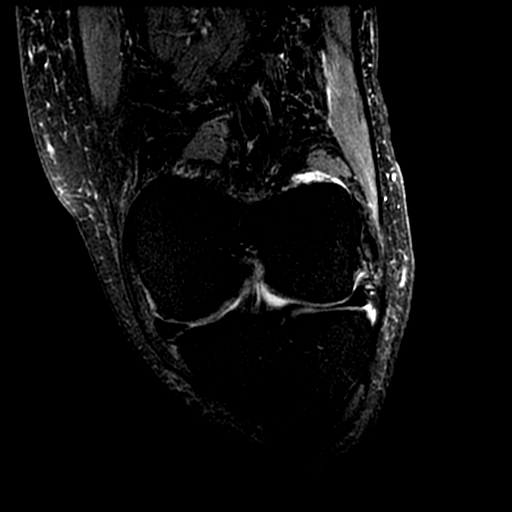
[im 12/12]
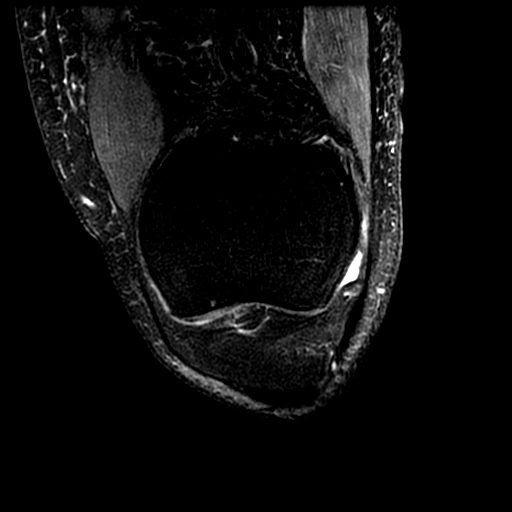

[19 of 40 positions shown; findings below may reference images not displayed]

FINDINGS: MENISCI

Medial meniscus: Focal increased signal from the posterior horn at
the posteromedial corner without a definitive tear.

Lateral meniscus:  Normal.

LIGAMENTS

Cruciates:  Normal.

Collaterals:  Normal.

CARTILAGE

Patellofemoral:  Normal.

Medial:  Normal.

Lateral:  Normal.

Joint:  Minimal joint effusion.

Popliteal Fossa:  Normal.

Extensor Mechanism:  Intact quadriceps tendon and patellar tendon.

Bones: Small effusion at the proximal tibiofibular joint,
nonspecific.

Other: Edema deep to the distal iliotibial band as well as increased
fluid in the lateral recess of knee joint. These findings can be
seen with iliotibial band syndrome.
IMPRESSION: 1. Findings suggestive of iliotibial band syndrome.
2. Nonspecific joint effusion at the proximal tibiofibular joint.
3. Intrinsic degeneration of the posterior horn of the medial
meniscus at the posteromedial corner without a definitive tear.

## 2017-10-04 ENCOUNTER — Telehealth: Payer: Self-pay | Admitting: Cardiology

## 2017-10-04 NOTE — Telephone Encounter (Signed)
Patient has been informed. Dr. Novella Rob office has been updated with this clearance per the patient's request.

## 2017-10-04 NOTE — Telephone Encounter (Signed)
Please call patient regarding surgery clearance. Same surgery that she was cleared for prior to this. She needs an answer before 10:00

## 2017-10-04 NOTE — Telephone Encounter (Signed)
Being reviewed by Dr. Tomie China. Will call patient.

## 2017-10-04 NOTE — Telephone Encounter (Signed)
Per the patient the is on a program to quit smoking, she now smokes <1/2 pack a week. Patient walks 2.5 miles 5 days a week, she does not experience chest pain with walking.

## 2018-10-03 DIAGNOSIS — M545 Low back pain, unspecified: Secondary | ICD-10-CM | POA: Insufficient documentation

## 2018-10-03 HISTORY — DX: Low back pain, unspecified: M54.50

## 2018-11-14 ENCOUNTER — Telehealth: Payer: Self-pay | Admitting: Cardiology

## 2018-11-14 NOTE — Telephone Encounter (Signed)
Please call patient about her work comp info please

## 2018-11-15 NOTE — Telephone Encounter (Signed)
Patient needs cardiac clearance for a workers comp procedure to be performed in [place]. RN expressed that patient will need a pre op appt with Dr. Docia Furl because she has not been seen since 2018. Patient had an upcoming appt but wants workers comp paperwork filled out to cover her until her appt with Dr. Docia Furl. Rn expressed that patient needs to go back to [facility] provider and have them submit wc information for her. P

## 2018-11-30 ENCOUNTER — Ambulatory Visit: Payer: BC Managed Care – PPO | Admitting: Cardiology

## 2018-12-07 ENCOUNTER — Other Ambulatory Visit: Payer: Self-pay

## 2018-12-07 ENCOUNTER — Encounter: Payer: Self-pay | Admitting: Cardiology

## 2018-12-07 ENCOUNTER — Ambulatory Visit (INDEPENDENT_AMBULATORY_CARE_PROVIDER_SITE_OTHER): Payer: No Typology Code available for payment source | Admitting: Cardiology

## 2018-12-07 VITALS — BP 142/78 | HR 64 | Ht 64.0 in | Wt 146.0 lb

## 2018-12-07 DIAGNOSIS — I10 Essential (primary) hypertension: Secondary | ICD-10-CM | POA: Diagnosis not present

## 2018-12-07 DIAGNOSIS — R0600 Dyspnea, unspecified: Secondary | ICD-10-CM

## 2018-12-07 DIAGNOSIS — R0789 Other chest pain: Secondary | ICD-10-CM

## 2018-12-07 DIAGNOSIS — Z8679 Personal history of other diseases of the circulatory system: Secondary | ICD-10-CM

## 2018-12-07 DIAGNOSIS — R079 Chest pain, unspecified: Secondary | ICD-10-CM

## 2018-12-07 DIAGNOSIS — R0609 Other forms of dyspnea: Secondary | ICD-10-CM

## 2018-12-07 DIAGNOSIS — E782 Mixed hyperlipidemia: Secondary | ICD-10-CM | POA: Diagnosis not present

## 2018-12-07 HISTORY — DX: Other forms of dyspnea: R06.09

## 2018-12-07 HISTORY — DX: Dyspnea, unspecified: R06.00

## 2018-12-07 HISTORY — DX: Other chest pain: R07.89

## 2018-12-07 NOTE — Progress Notes (Signed)
Cardiology Office Note:    Date:  12/07/2018   ID:  Kathryn Holmes, DOB [birth year], MRN [id number]  PCP:  Lucianne Lei, MD  Cardiologist:  Garwin Brothers, MD   Referring MD: Lucianne Lei, MD    ASSESSMENT:    1. Essential hypertension   2. Mixed hyperlipidemia   3. History of cardiomyopathy    PLAN:    In order of problems listed above:  1. Primary prevention stressed with the patient.  Importance of compliance with diet and medication stressed and she vocalized understanding. 2. Her blood pressure is stable.  Lipids followed by primary care physician. 3. Ex-smoker: She promises never to get back to smoking again.  I had an extensive discussion with her about risks of smoking. 4. She needs to get back to work and her cardiac status needs to be assessed.  She has dyspnea on exertion and some chest tightness at times.  We will do a Lexiscan sestamibi to assess the symptoms to understand her eligibility to get back to work. 5. Patient will be seen in follow-up appointment in 6 months or earlier if the patient has any concerns    Medication Adjustments/Labs and Tests Ordered: Current medicines are reviewed at length with the patient today.  Concerns regarding medicines are outlined above.  No orders of the defined types were placed in this encounter.  No orders of the defined types were placed in this encounter.    No chief complaint on file.    History of Present Illness:    Kathryn Holmes is a 53 y.o. female.  Patient has past medical history of cardiomyopathy with good resolution, essential hypertension and dyslipidemia.  She is an ex-smoker but quit smoking about 2 months ago.  She has had shoulder surgery and is planning to get back to work and they want her reassessed.  She is here for cardiovascular assessment.  She leads a fairly sedentary lifestyle.  No chest pain orthopnea or PND.  She is recovering from shoulder surgery.  Past Medical History:  Diagnosis Date   . Allergic rhinitis   . Anxiety and depression   . Elevated LFTs   . GERD (gastroesophageal reflux disease) 12/23/2016  . Hyperlipidemia 12/23/2016  . Hypertension   . Ischemic heart disease   . Rotator cuff tear    Left shoulder  . Trigger finger   . Vitamin D deficiency 12/23/2016    Past Surgical History:  Procedure Laterality Date  . APPENDECTOMY      Current Medications: Current Meds  Medication Sig  . Ascorbic Acid (VITAMIN C) 1000 MG tablet Take 1,000 mg by mouth daily.  Marland Kitchen aspirin EC 81 MG tablet Take 81 mg by mouth daily.  Marland Kitchen atorvastatin (LIPITOR) 80 MG tablet   . buPROPion (WELLBUTRIN XL) 150 MG 24 hr tablet   . calcium carbonate (OS-CAL - DOSED IN MG OF ELEMENTAL CALCIUM) 1250 (500 Ca) MG tablet Take 1 tablet by mouth.  . fluticasone (FLONASE) 50 MCG/ACT nasal spray   . lisinopril-hydrochlorothiazide (PRINZIDE,ZESTORETIC) 10-12.5 MG tablet   . metoprolol succinate (TOPROL-XL) 50 MG 24 hr tablet daily.  . montelukast (SINGULAIR) 10 MG tablet   . Multiple Vitamin (MULTIVITAMIN) capsule Take 1 capsule by mouth daily.  Marland Kitchen MYRBETRIQ 50 MG TB24 tablet daily.  . nitroGLYCERIN (NITROSTAT) 0.4 MG SL tablet Place 0.4 mg under the tongue every 5 (five) minutes as needed for chest pain.  . Omega-3 Fatty Acids (FISH OIL) 1000 MG CAPS Take 1,000 mg by  mouth 2 (two) times daily.  Marland Kitchen omeprazole (PRILOSEC) 40 MG capsule   . PROAIR HFA 108 (90 Base) MCG/ACT inhaler   . sertraline (ZOLOFT) 100 MG tablet   . Vitamin D, Ergocalciferol, (DRISDOL) 50000 units CAPS capsule      Allergies:   Patient has no known allergies.   Social History   Socioeconomic History  . Marital status: Single    Spouse name: Not on file  . Number of children: Not on file  . Years of education: Not on file  . Highest education level: Not on file  Occupational History  . Not on file  Social Needs  . Financial resource strain: Not on file  . Food insecurity    Worry: Not on file    Inability: Not on  file  . Transportation needs    Medical: Not on file    Non-medical: Not on file  Tobacco Use  . Smoking status: Current Every Day Smoker  . Smokeless tobacco: Never Used  Substance and Sexual Activity  . Alcohol use: No    Frequency: Never  . Drug use: No  . Sexual activity: Not on file  Lifestyle  . Physical activity    Days per week: Not on file    Minutes per session: Not on file  . Stress: Not on file  Relationships  . Social Herbalist on phone: Not on file    Gets together: Not on file    Attends religious service: Not on file    Active member of club or organization: Not on file    Attends meetings of clubs or organizations: Not on file    Relationship status: Not on file  Other Topics Concern  . Not on file  Social History Narrative  . Not on file     Family History: The patient's family history includes Heart attack in her father; Hypercholesterolemia in her father; Hypertension in her brother, mother, and sister.  ROS:   Please see the history of present illness.    All other systems reviewed and are negative.  EKGs/Labs/Other Studies Reviewed:    The following studies were reviewed today: I discussed my findings with the patient at length   Recent Labs: No results found for requested labs within last 8760 hours.  Recent Lipid Panel No results found for: CHOL, TRIG, HDL, CHOLHDL, VLDL, LDLCALC, LDLDIRECT  Physical Exam:    VS:  BP (!) 142/78 (BP Location: Left Arm, Patient Position: Sitting, Cuff Size: Normal)   Pulse 64   Ht 5\' 4"  (1.626 m)   Wt 146 lb (66.2 kg)   SpO2 96%   BMI 25.06 kg/m     Wt Readings from Last 3 Encounters:  12/07/18 146 lb (66.2 kg)  12/26/16 156 lb (70.8 kg)  04/19/16 147 lb (66.7 kg)     GEN: Patient is in no acute distress HEENT: Normal NECK: No JVD; No carotid bruits LYMPHATICS: No lymphadenopathy CARDIAC: Hear sounds regular, 2/6 systolic murmur at the apex. RESPIRATORY:  Clear to auscultation  without rales, wheezing or rhonchi  ABDOMEN: Soft, non-tender, non-distended MUSCULOSKELETAL:  No edema; No deformity  SKIN: Warm and dry NEUROLOGIC:  Alert and oriented x 3 PSYCHIATRIC:  Normal affect   Signed, Jenean Lindau, MD  12/07/2018 2:45 PM    [facility]

## 2018-12-07 NOTE — Patient Instructions (Addendum)
Medication Instructions:  Your physician recommends that you continue on your current medications as directed. Please refer to the Current Medication list given to you today.  *If you need a refill on your cardiac medications before your next appointment, please call your pharmacy*  Lab Work: NONE If you have labs (blood work) drawn today and your tests are completely normal, you will receive your results only by: Marland Kitchen MyChart Message (if you have MyChart) OR . A paper copy in the mail If you have any lab test that is abnormal or we need to change your treatment, we will call you to review the results.  Testing/Procedures: You had an EKG Performed today  Your physician has requested that you have a lexiscan myoview. For further information please visit [email or web address] Please follow instruction sheet, as given.   Follow-Up: At [facility], you and your health needs are our priority.  As part of our continuing mission to provide you with exceptional heart care, we have created designated Provider Care Teams.  These Care Teams include your primary Cardiologist (physician) and Advanced Practice Providers (APPs -  Physician Assistants and Nurse Practitioners) who all work together to provide you with the care you need, when you need it.  Your next appointment:   6 months  The format for your next appointment:   In Person  Provider:   Belva Crome, MD  Other Instructions Regadenoson injection What is this medicine? REGADENOSON is used to test the heart for coronary artery disease. It is used in patients who can not exercise for their stress test. This medicine may be used for other purposes; ask your health care provider or pharmacist if you have questions. COMMON BRAND NAME(S): Lexiscan What should I tell my health care provider before I take this medicine? They need to know if you have any of these conditions:  heart problems  lung or breathing disease, like asthma or COPD   an unusual or allergic reaction to regadenoson, other medicines, foods, dyes, or preservatives  pregnant or trying to get pregnant  breast-feeding How should I use this medicine? This medicine is for injection into a vein. It is given by a health care professional in a hospital or clinic setting. Talk to your pediatrician regarding the use of this medicine in children. Special care may be needed. Overdosage: If you think you have taken too much of this medicine contact a poison control center or emergency room at once. NOTE: This medicine is only for you. Do not share this medicine with others. What if I miss a dose? This does not apply. What may interact with this medicine?  caffeine  dipyridamole  guarana  theophylline This list may not describe all possible interactions. Give your health care provider a list of all the medicines, herbs, non-prescription drugs, or dietary supplements you use. Also tell them if you smoke, drink alcohol, or use illegal drugs. Some items may interact with your medicine. What should I watch for while using this medicine? Your condition will be monitored carefully while you are receiving this medicine. Do not take medicines, foods, or drinks with caffeine (like coffee, tea, or colas) for at least 12 hours before your test. If you do not know if something contains caffeine, ask your health care professional. What side effects may I notice from receiving this medicine? Side effects that you should report to your doctor or health care professional as soon as possible:  allergic reactions like skin rash, itching or hives, swelling of the  face, lips, or tongue  breathing problems  chest pain, tightness or palpitations  severe headache Side effects that usually do not require medical attention (report to your doctor or health care professional if they continue or are bothersome):  flushing  headache  irritation or pain at site where injected   nausea, vomiting This list may not describe all possible side effects. Call your doctor for medical advice about side effects. You may report side effects to FDA at 1-800-FDA-1088. Where should I keep my medicine? This drug is given in a hospital or clinic and will not be stored at home. NOTE: This sheet is a summary. It may not cover all possible information. If you have questions about this medicine, talk to your doctor, pharmacist, or health care provider.  2020 Elsevier/Gold Standard (2007-09-10 15:08:13)  Cardiac Nuclear Scan A cardiac nuclear scan is a test that is done to check the flow of blood to your heart. It is done when you are resting and when you are exercising. The test looks for problems such as:  Not enough blood reaching a portion of the heart.  The heart muscle not working as it should. You may need this test if:  You have heart disease.  You have had lab results that are not normal.  You have had heart surgery or a balloon procedure to open up blocked arteries (angioplasty).  You have chest pain.  You have shortness of breath. In this test, a special dye (tracer) is put into your bloodstream. The tracer will travel to your heart. A camera will then take pictures of your heart to see how the tracer moves through your heart. This test is usually done at a hospital and takes 2-4 hours. Tell a doctor about:  Any allergies you have.  All medicines you are taking, including vitamins, herbs, eye drops, creams, and over-the-counter medicines.  Any problems you or family members have had with anesthetic medicines.  Any blood disorders you have.  Any surgeries you have had.  Any medical conditions you have.  Whether you are pregnant or may be pregnant. What are the risks? Generally, this is a safe test. However, problems may occur, such as:  Serious chest pain and heart attack. This is only a risk if the stress portion of the test is done.  Rapid heartbeat.   A feeling of warmth in your chest. This feeling usually does not last long.  Allergic reaction to the tracer. What happens before the test?  Ask your doctor about changing or stopping your normal medicines. This is important.  Follow instructions from your doctor about what you cannot eat or drink.  Remove your jewelry on the day of the test. What happens during the test?  An IV tube will be inserted into one of your veins.  Your doctor will give you a small amount of tracer through the IV tube.  You will wait for 20-40 minutes while the tracer moves through your bloodstream.  Your heart will be monitored with an electrocardiogram (ECG).  You will lie down on an exam table.  Pictures of your heart will be taken for about 15-20 minutes.  You may also have a stress test. For this test, one of these things may be done: ? You will be asked to exercise on a treadmill or a stationary bike. ? You will be given medicines that will make your heart work harder. This is done if you are unable to exercise.  When blood flow to  your heart has peaked, a tracer will again be given through the IV tube.  After 20-40 minutes, you will get back on the exam table. More pictures will be taken of your heart.  Depending on the tracer that is used, more pictures may need to be taken 3-4 hours later.  Your IV tube will be removed when the test is over. The test may vary among doctors and hospitals. What happens after the test?  Ask your doctor: ? Whether you can return to your normal schedule, including diet, activities, and medicines. ? Whether you should drink more fluids. This will help to remove the tracer from your body. Drink enough fluid to keep your pee (urine) pale yellow.  Ask your doctor, or the department that is doing the test: ? When will my results be ready? ? How will I get my results? Summary  A cardiac nuclear scan is a test that is done to check the flow of blood to your  heart.  Tell your doctor whether you are pregnant or may be pregnant.  Before the test, ask your doctor about changing or stopping your normal medicines. This is important.  Ask your doctor whether you can return to your normal activities. You may be asked to drink more fluids. This information is not intended to replace advice given to you by your health care provider. Make sure you discuss any questions you have with your health care provider. Document Released: 06/26/2017 Document Revised: 05/02/2018 Document Reviewed: 06/26/2017 Elsevier Patient Education  2020 [employer].

## 2018-12-11 ENCOUNTER — Telehealth (HOSPITAL_COMMUNITY): Payer: Self-pay

## 2018-12-11 NOTE — Telephone Encounter (Signed)
Attempted to reach the patient with her instructions for her stress test. Asked to call back with any questions. S.Deicy Rusk EMTP

## 2018-12-13 ENCOUNTER — Encounter (HOSPITAL_COMMUNITY): Payer: BC Managed Care – PPO

## 2018-12-19 ENCOUNTER — Ambulatory Visit (HOSPITAL_COMMUNITY): Payer: No Typology Code available for payment source | Attending: Cardiovascular Disease

## 2018-12-19 ENCOUNTER — Other Ambulatory Visit: Payer: Self-pay

## 2018-12-19 VITALS — Ht 64.0 in | Wt 146.0 lb

## 2018-12-19 DIAGNOSIS — R0789 Other chest pain: Secondary | ICD-10-CM | POA: Insufficient documentation

## 2018-12-19 DIAGNOSIS — R0609 Other forms of dyspnea: Secondary | ICD-10-CM

## 2018-12-19 DIAGNOSIS — R0600 Dyspnea, unspecified: Secondary | ICD-10-CM | POA: Diagnosis present

## 2018-12-19 DIAGNOSIS — R079 Chest pain, unspecified: Secondary | ICD-10-CM | POA: Insufficient documentation

## 2018-12-19 LAB — MYOCARDIAL PERFUSION IMAGING
LV dias vol: 71 mL (ref 46–106)
LV sys vol: 21 mL
Peak HR: 88 {beats}/min
Rest HR: 56 {beats}/min
SDS: 4
SRS: 2
SSS: 6
TID: 0.94

## 2018-12-19 MED ORDER — TECHNETIUM TC 99M TETROFOSMIN IV KIT
32.7000 | PACK | Freq: Once | INTRAVENOUS | Status: AC | PRN
  Administered 2018-12-19: 32.7 via INTRAVENOUS
  Filled 2018-12-19: qty 33

## 2018-12-19 MED ORDER — REGADENOSON 0.4 MG/5ML IV SOLN
0.4000 mg | Freq: Once | INTRAVENOUS | Status: AC
  Administered 2018-12-19: 0.4 mg via INTRAVENOUS

## 2018-12-19 MED ORDER — TECHNETIUM TC 99M TETROFOSMIN IV KIT
10.1000 | PACK | Freq: Once | INTRAVENOUS | Status: AC | PRN
  Administered 2018-12-19: 10.1 via INTRAVENOUS
  Filled 2018-12-19: qty 11

## 2018-12-25 ENCOUNTER — Telehealth: Payer: Self-pay | Admitting: Cardiology

## 2018-12-25 NOTE — Telephone Encounter (Signed)
Patient is scheduled for surgery tomorrow and they need cleareance form sent back asap!!  [phone] (fax)

## 2019-01-02 ENCOUNTER — Telehealth: Payer: Self-pay

## 2019-01-02 NOTE — Telephone Encounter (Signed)
Results relayed on 12/25/2018, cardiac clearance faxed.

## 2019-01-02 NOTE — Telephone Encounter (Signed)
-----   Message from Rajan R Revankar, MD sent at 12/21/2018  9:48 AM EST ----- The results of the study is unremarkable. Please inform patient. I will discuss in detail at next appointment. Cc  primary care/referring physician Rajan R Revankar, MD 12/21/2018 9:48 AM 

## 2019-06-19 ENCOUNTER — Ambulatory Visit: Payer: BC Managed Care – PPO | Admitting: Cardiology

## 2019-06-19 ENCOUNTER — Encounter: Payer: Self-pay | Admitting: Cardiology

## 2019-06-19 ENCOUNTER — Other Ambulatory Visit: Payer: Self-pay

## 2019-06-19 VITALS — BP 132/76 | HR 78 | Ht 63.0 in | Wt 152.2 lb

## 2019-06-19 DIAGNOSIS — I10 Essential (primary) hypertension: Secondary | ICD-10-CM | POA: Diagnosis not present

## 2019-06-19 DIAGNOSIS — E782 Mixed hyperlipidemia: Secondary | ICD-10-CM

## 2019-06-19 DIAGNOSIS — F17210 Nicotine dependence, cigarettes, uncomplicated: Secondary | ICD-10-CM | POA: Diagnosis not present

## 2019-06-19 NOTE — Patient Instructions (Signed)

## 2019-06-19 NOTE — Progress Notes (Signed)
Cardiology Office Note:    Date:  06/19/2019   ID:  Kathryn Holmes, DOB 1965/06/25, MRN [id number]  PCP:  Lucianne Lei, MD  Cardiologist:  Garwin Brothers, MD   Referring MD: Lucianne Lei, MD    ASSESSMENT:    1. Essential hypertension   2. Mixed hyperlipidemia   3. Continuous dependence on cigarette smoking    PLAN:    In order of problems listed above:  1. Primary prevention stressed with the patient.  Importance of compliance with diet medication stressed and she vocalized understanding.  I advised her to walk at least half an hour a day 5 days a week and she promises to do so. 2. Essential hypertension: Blood pressure stable and diet was emphasized 3. Mixed dyslipidemia: On statin therapy and managed by primary care physician.  She will have blood work in the month of June and will remind her doctors to send me a copy of the reports. 4. Cigarette smoker: Patient has quit smoking 2 weeks ago and I encouraged her to never to go back.  I spent 5 minutes with the patient discussing solely about smoking. Smoking cessation was counseled. I suggested to the patient also different medications and pharmacological interventions. Patient is keen to try stopping on its own at this time. He will get back to me if he needs any further assistance in this matter. 5. Patient will be seen in follow-up appointment in 6 months or earlier if the patient has any concerns    Medication Adjustments/Labs and Tests Ordered: Current medicines are reviewed at length with the patient today.  Concerns regarding medicines are outlined above.  No orders of the defined types were placed in this encounter.  No orders of the defined types were placed in this encounter.    No chief complaint on file.    History of Present Illness:    Kathryn Holmes is a 54 y.o. female.  Patient has past medical history of cardiomyopathy, essential hypertension dyslipidemia and cigarette smoking.  She leads a sedentary  lifestyle.  She denies any chest pain orthopnea or PND.  She mentions to me that she is planning to get blood work done in the month of June by primary care physician.  At the time of my evaluation, the patient is alert awake oriented and in no distress.  Past Medical History:  Diagnosis Date  . Allergic rhinitis   . Anxiety and depression   . Chest discomfort 12/07/2018  . Continuous dependence on cigarette smoking 12/26/2016  . DOE (dyspnea on exertion) 12/07/2018  . Elevated LFTs   . Essential hypertension 12/26/2016  . GERD (gastroesophageal reflux disease) 12/23/2016  . History of cardiomyopathy 12/26/2016  . Hyperlipidemia 12/23/2016  . Hypertension   . Ischemic heart disease   . Lumbar pain 10/03/2018  . Preoperative cardiovascular examination 12/26/2016  . Rotator cuff tear    Left shoulder  . Trigger finger   . Vitamin D deficiency 12/23/2016    Past Surgical History:  Procedure Laterality Date  . APPENDECTOMY      Current Medications: Current Meds  Medication Sig  . Ascorbic Acid (VITAMIN C) 1000 MG tablet Take 1,000 mg by mouth daily.  Marland Kitchen aspirin EC 81 MG tablet Take 81 mg by mouth daily.  Marland Kitchen atorvastatin (LIPITOR) 80 MG tablet   . baclofen (LIORESAL) 10 MG tablet Take 10 mg by mouth 2 (two) times daily as needed.  . fluticasone (FLONASE) 50 MCG/ACT nasal spray   . ibuprofen (  ADVIL) 800 MG tablet Take 800 mg by mouth 3 (three) times daily as needed.  Marland Kitchen lisinopril-hydrochlorothiazide (PRINZIDE,ZESTORETIC) 10-12.5 MG tablet   . LORazepam (ATIVAN) 1 MG tablet Take 1 mg by mouth at bedtime as needed.  . metoprolol succinate (TOPROL-XL) 50 MG 24 hr tablet daily.  . montelukast (SINGULAIR) 10 MG tablet   . Multiple Vitamin (MULTIVITAMIN) capsule Take 1 capsule by mouth daily.  Marland Kitchen MYRBETRIQ 50 MG TB24 tablet daily.  . nitroGLYCERIN (NITROSTAT) 0.4 MG SL tablet Place 0.4 mg under the tongue every 5 (five) minutes as needed for chest pain.  Marland Kitchen olopatadine (PATANOL) 0.1 %  ophthalmic solution Place 1 drop into both eyes 3 (three) times daily.  . Omega-3 Fatty Acids (FISH OIL) 1000 MG CAPS Take 1,000 mg by mouth 2 (two) times daily.  Marland Kitchen omeprazole (PRILOSEC) 40 MG capsule   . PROAIR HFA 108 (90 Base) MCG/ACT inhaler   . promethazine (PHENERGAN) 12.5 MG tablet Take 12.5 mg by mouth every 8 (eight) hours as needed.  . sertraline (ZOLOFT) 100 MG tablet   . traMADol (ULTRAM) 50 MG tablet Take 50 mg by mouth 2 (two) times daily as needed.  . Vitamin D, Ergocalciferol, (DRISDOL) 50000 units CAPS capsule   . VYVANSE 70 MG capsule Take 70 mg by mouth every morning.     Allergies:   Patient has no known allergies.   Social History   Socioeconomic History  . Marital status: Single    Spouse name: Not on file  . Number of children: Not on file  . Years of education: Not on file  . Highest education level: Not on file  Occupational History  . Not on file  Tobacco Use  . Smoking status: Current Every Day Smoker  . Smokeless tobacco: Never Used  Substance and Sexual Activity  . Alcohol use: No  . Drug use: No  . Sexual activity: Not on file  Other Topics Concern  . Not on file  Social History Narrative  . Not on file   Social Determinants of Health   Financial Resource Strain:   . Difficulty of Paying Living Expenses:   Food Insecurity:   . Worried About Programme researcher, broadcasting/film/video in the Last Year:   . Barista in the Last Year:   Transportation Needs:   . Freight forwarder (Medical):   Marland Kitchen Lack of Transportation (Non-Medical):   Physical Activity:   . Days of Exercise per Week:   . Minutes of Exercise per Session:   Stress:   . Feeling of Stress :   Social Connections:   . Frequency of Communication with Friends and Family:   . Frequency of Social Gatherings with Friends and Family:   . Attends Religious Services:   . Active Member of Clubs or Organizations:   . Attends Banker Meetings:   Marland Kitchen Marital Status:      Family  History: The patient's family history includes Heart attack in her father; Hypercholesterolemia in her father; Hypertension in her brother, mother, and sister.  ROS:   Please see the history of present illness.    All other systems reviewed and are negative.  EKGs/Labs/Other Studies Reviewed:    The following studies were reviewed today: Patient has had a stress test a few months ago and this was unremarkable.  She does walk on a regular basis.  Study Highlights   Nuclear stress EF: 70%.  There was no ST segment deviation noted during stress.  The study is normal.  This is a low risk study.  The left ventricular ejection fraction is hyperdynamic (>65%).      Recent Labs: No results found for requested labs within last 8760 hours.  Recent Lipid Panel No results found for: CHOL, TRIG, HDL, CHOLHDL, VLDL, LDLCALC, LDLDIRECT  Physical Exam:    VS:  BP 132/76   Pulse 78   Ht 5\' 3"  (1.6 m)   Wt 152 lb 3.2 oz (69 kg)   SpO2 95%   BMI 26.96 kg/m     Wt Readings from Last 3 Encounters:  06/19/19 152 lb 3.2 oz (69 kg)  12/19/18 146 lb (66.2 kg)  12/07/18 146 lb (66.2 kg)     GEN: Patient is in no acute distress HEENT: Normal NECK: No JVD; No carotid bruits LYMPHATICS: No lymphadenopathy CARDIAC: Hear sounds regular, 2/6 systolic murmur at the apex. RESPIRATORY:  Clear to auscultation without rales, wheezing or rhonchi  ABDOMEN: Soft, non-tender, non-distended MUSCULOSKELETAL:  No edema; No deformity  SKIN: Warm and dry NEUROLOGIC:  Alert and oriented x 3 PSYCHIATRIC:  Normal affect   Signed, Jenean Lindau, MD  06/19/2019 10:24 AM    [place]

## 2019-10-31 DIAGNOSIS — L03011 Cellulitis of right finger: Secondary | ICD-10-CM | POA: Insufficient documentation

## 2019-10-31 HISTORY — DX: Cellulitis of right finger: L03.011

## 2020-04-23 DIAGNOSIS — J309 Allergic rhinitis, unspecified: Secondary | ICD-10-CM | POA: Insufficient documentation

## 2020-04-23 DIAGNOSIS — M75100 Unspecified rotator cuff tear or rupture of unspecified shoulder, not specified as traumatic: Secondary | ICD-10-CM | POA: Insufficient documentation

## 2020-04-23 DIAGNOSIS — M6530 Trigger finger, unspecified finger: Secondary | ICD-10-CM | POA: Insufficient documentation

## 2020-04-23 DIAGNOSIS — I10 Essential (primary) hypertension: Secondary | ICD-10-CM | POA: Insufficient documentation

## 2020-04-23 DIAGNOSIS — F419 Anxiety disorder, unspecified: Secondary | ICD-10-CM | POA: Insufficient documentation

## 2020-04-23 DIAGNOSIS — F32A Depression, unspecified: Secondary | ICD-10-CM | POA: Insufficient documentation

## 2020-04-24 ENCOUNTER — Ambulatory Visit: Payer: BC Managed Care – PPO | Admitting: Cardiology

## 2021-07-28 ENCOUNTER — Other Ambulatory Visit: Payer: Self-pay

## 2021-07-28 ENCOUNTER — Ambulatory Visit: Payer: BC Managed Care – PPO | Admitting: Cardiology

## 2021-07-28 VITALS — BP 112/60 | HR 69 | Ht 64.0 in | Wt 157.2 lb

## 2021-07-28 DIAGNOSIS — Z01810 Encounter for preprocedural cardiovascular examination: Secondary | ICD-10-CM

## 2021-07-28 DIAGNOSIS — B3731 Acute candidiasis of vulva and vagina: Secondary | ICD-10-CM | POA: Insufficient documentation

## 2021-07-28 DIAGNOSIS — F331 Major depressive disorder, recurrent, moderate: Secondary | ICD-10-CM | POA: Insufficient documentation

## 2021-07-28 DIAGNOSIS — E782 Mixed hyperlipidemia: Secondary | ICD-10-CM | POA: Diagnosis not present

## 2021-07-28 DIAGNOSIS — R41840 Attention and concentration deficit: Secondary | ICD-10-CM

## 2021-07-28 DIAGNOSIS — I10 Essential (primary) hypertension: Secondary | ICD-10-CM | POA: Diagnosis not present

## 2021-07-28 DIAGNOSIS — R5382 Chronic fatigue, unspecified: Secondary | ICD-10-CM | POA: Insufficient documentation

## 2021-07-28 DIAGNOSIS — R0609 Other forms of dyspnea: Secondary | ICD-10-CM

## 2021-07-28 DIAGNOSIS — F17210 Nicotine dependence, cigarettes, uncomplicated: Secondary | ICD-10-CM | POA: Diagnosis not present

## 2021-07-28 HISTORY — DX: Acute candidiasis of vulva and vagina: B37.31

## 2021-07-28 HISTORY — DX: Attention and concentration deficit: R41.840

## 2021-07-28 HISTORY — DX: Major depressive disorder, recurrent, moderate: F33.1

## 2021-07-28 HISTORY — DX: Mixed hyperlipidemia: E78.2

## 2021-07-28 HISTORY — DX: Encounter for preprocedural cardiovascular examination: Z01.810

## 2021-07-28 HISTORY — DX: Chronic fatigue, unspecified: R53.82

## 2021-07-28 NOTE — Progress Notes (Signed)
Cardiology Office Note:    Date:  07/28/2021   ID:  Kathryn Holmes, DOB 1965/11/09, MRN [id number]  PCP:  Lucianne Lei, MD  Cardiologist:  Garwin Brothers, MD   Referring MD: Lucianne Lei, MD    ASSESSMENT:    1. Essential hypertension   2. Mixed hyperlipidemia   3. Preop cardiovascular exam   4. Continuous dependence on cigarette smoking   5. DOE (dyspnea on exertion)    PLAN:    In order of problems listed above:  Prevention stressed with the patient.  Importance of compliance with diet medication stressed and she vocalized understanding. Preop cardiovascular assessment and dyspnea on exertion: She has multiple risk factors for coronary artery disease including significant cigarette smoking and therefore we will do a CT coronary angiography with FFR.  She is agreeable. Essential hypertension: Blood pressure stable and diet was emphasized.  Lifestyle modification urged.   Cigarette smoker: I spent 5 minutes with the patient discussing solely about smoking. Smoking cessation was counseled. I suggested to the patient also different medications and pharmacological interventions. Patient is keen to try stopping on its own at this time. He will get back to me if he needs any further assistance in this matter.    Medication Adjustments/Labs and Tests Ordered: Current medicines are reviewed at length with the patient today.  Concerns regarding medicines are outlined above.  No orders of the defined types were placed in this encounter.  No orders of the defined types were placed in this encounter.    No chief complaint on file.    History of Present Illness:    Kathryn Holmes is a 56 y.o. female.  Patient has past medical history of essential hypertension, mixed dyslipidemia and smoking.  She complains of dyspnea on exertion.  She is here for preop assessment for carpal tunnel surgery.  She leads a sedentary lifestyle.  Unfortunately she continues to smoke.  She denies any chest  pain orthopnea or PND.  At the time of my evaluation, the patient is alert awake oriented and in no distress.  Past Medical History:  Diagnosis Date   Allergic rhinitis    Anxiety and depression    Attention and concentration deficit 07/28/2021   Candidal vulvovaginitis 07/28/2021   Chest discomfort 12/07/2018   Chronic fatigue, unspecified 07/28/2021   Continuous dependence on cigarette smoking 12/26/2016   DOE (dyspnea on exertion) 12/07/2018   Elevated LFTs    Essential hypertension    GERD (gastroesophageal reflux disease) 12/23/2016   History of cardiomyopathy 12/26/2016   Hyperlipidemia 12/23/2016   Hypertension    Ischemic heart disease    Lumbar pain 10/03/2018   Mixed hyperlipidemia 07/28/2021   Moderate recurrent major depression (HCC) 07/28/2021   Paronychia of finger of right hand 10/31/2019   Preoperative cardiovascular examination 12/26/2016   Rotator cuff tear    Left shoulder   Trigger finger    Vitamin D deficiency 12/23/2016    Past Surgical History:  Procedure Laterality Date   APPENDECTOMY      Current Medications: Current Meds  Medication Sig   albuterol (VENTOLIN HFA) 108 (90 Base) MCG/ACT inhaler Inhale 1 puff into the lungs every 6 (six) hours as needed for wheezing or shortness of breath.   Ascorbic Acid (VITAMIN C) 1000 MG tablet Take 1,000 mg by mouth daily.   aspirin EC 81 MG tablet Take 81 mg by mouth daily.   atorvastatin (LIPITOR) 80 MG tablet Take 80 mg by mouth daily.  cyanocobalamin (,VITAMIN B-12,) 1000 MCG/ML injection Inject 1,000 mcg into the muscle every 30 (thirty) days.   ergocalciferol (VITAMIN D2) 1.25 MG (50000 UT) capsule Take 50,000 Units by mouth once a week.   fluticasone (FLONASE) 50 MCG/ACT nasal spray Place 1 spray into both nostrils as needed for rhinitis or allergies.   ibuprofen (ADVIL) 800 MG tablet Take 800 mg by mouth 3 (three) times daily as needed for pain.   lisinopril-hydrochlorothiazide (ZESTORETIC) 10-12.5 MG tablet  Take 1 tablet by mouth daily.   metoprolol succinate (TOPROL-XL) 50 MG 24 hr tablet Take 50 mg by mouth daily. Take with or immediately following a meal.   mirabegron ER (MYRBETRIQ) 50 MG TB24 tablet Take 50 mg by mouth daily.   montelukast (SINGULAIR) 10 MG tablet Take 10 mg by mouth at bedtime.   Multiple Vitamin (MULTIVITAMIN) capsule Take 1 capsule by mouth daily.   nitroGLYCERIN (NITROSTAT) 0.4 MG SL tablet Place 0.4 mg under the tongue every 5 (five) minutes as needed for chest pain.   olopatadine (PATANOL) 0.1 % ophthalmic solution Place 1 drop into both eyes 3 (three) times daily.   Omega-3 Fatty Acids (FISH OIL) 1000 MG CAPS Take 1,000 mg by mouth 2 (two) times daily.   omeprazole (PRILOSEC) 40 MG capsule Take 40 mg by mouth daily.   promethazine (PHENERGAN) 12.5 MG tablet Take 12.5 mg by mouth every 6 (six) hours as needed for nausea/vomiting.   sertraline (ZOLOFT) 100 MG tablet Take 100 mg by mouth daily.   traMADol (ULTRAM) 50 MG tablet Take 50 mg by mouth as needed for pain.   VYVANSE 70 MG capsule Take 70 mg by mouth every morning.     Allergies:   Patient has no known allergies.   Social History   Socioeconomic History   Marital status: Single    Spouse name: Not on file   Number of children: Not on file   Years of education: Not on file   Highest education level: Not on file  Occupational History   Not on file  Tobacco Use   Smoking status: Every Day   Smokeless tobacco: Never  Substance and Sexual Activity   Alcohol use: No   Drug use: No   Sexual activity: Not on file  Other Topics Concern   Not on file  Social History Narrative   Not on file   Social Determinants of Health   Financial Resource Strain: Not on file  Food Insecurity: Not on file  Transportation Needs: Not on file  Physical Activity: Not on file  Stress: Not on file  Social Connections: Not on file     Family History: The patient's family history includes Heart attack in her father;  Hypercholesterolemia in her father; Hypertension in her brother, mother, and sister.  ROS:   Please see the history of present illness.    All other systems reviewed and are negative.  EKGs/Labs/Other Studies Reviewed:    The following studies were reviewed today: I discussed my findings with the patient at length.  EKG reveals sinus rhythm and nonspecific ST-T changes   Recent Labs: No results found for requested labs within last 365 days.  Recent Lipid Panel No results found for: "CHOL", "TRIG", "HDL", "CHOLHDL", "VLDL", "LDLCALC", "LDLDIRECT"  Physical Exam:    VS:  BP 112/60   Pulse 69   Ht 5\' 4"  (1.626 m)   Wt 157 lb 3.2 oz (71.3 kg)   SpO2 99%   BMI 26.98 kg/m  Wt Readings from Last 3 Encounters:  07/28/21 157 lb 3.2 oz (71.3 kg)  06/19/19 152 lb 3.2 oz (69 kg)  12/19/18 146 lb (66.2 kg)     GEN: Patient is in no acute distress HEENT: Normal NECK: No JVD; No carotid bruits LYMPHATICS: No lymphadenopathy CARDIAC: Hear sounds regular, 2/6 systolic murmur at the apex. RESPIRATORY:  Clear to auscultation without rales, wheezing or rhonchi  ABDOMEN: Soft, non-tender, non-distended MUSCULOSKELETAL:  No edema; No deformity  SKIN: Warm and dry NEUROLOGIC:  Alert and oriented x 3 PSYCHIATRIC:  Normal affect   Signed, Garwin Brothers, MD  07/28/2021 9:34 AM     [facility]

## 2021-07-28 NOTE — Patient Instructions (Signed)
Medication Instructions:  Your physician recommends that you continue on your current medications as directed. Please refer to the Current Medication list given to you today.   *If you need a refill on your cardiac medications before your next appointment, please call your pharmacy*   Lab Work: None ordered  If you have labs (blood work) drawn today and your tests are completely normal, you will receive your results only by: MyChart Message (if you have MyChart) OR A paper copy in the mail If you have any lab test that is abnormal or we need to change your treatment, we will call you to review the results.   Testing/Procedures:   Your cardiac CT will be scheduled at one of the below locations:   [facility] [address] [place], [place] [address] [phone]   At [facility], please arrive at the [facility] (Entrance C2) of [facility] 30 minutes prior to test start time. You can use the FREE valet parking offered at entrance C (encouraged to control the heart rate for the test)  Proceed to the [facility] Radiology Department (first floor) to check-in and test prep.  All radiology patients and guests should use entrance C2 at [facility], accessed from [facility], even though the hospital's physical address listed is [address].      Please follow these instructions carefully (unless otherwise directed):   On the Night Before the Test: Be sure to Drink plenty of water. Do not consume any caffeinated/decaffeinated beverages or chocolate 12 hours prior to your test. Do not take any antihistamines 12 hours prior to your test.  On the Day of the Test: Drink plenty of water until 1 hour prior to the test. Do not eat any food 4 hours prior to the test. You may take your regular medications prior to the test.  Take metoprolol (Lopressor) two hours prior to test. FEMALES- please wear  underwire-free bra if available, avoid dresses & tight clothing.  After the Test: Drink plenty of water. After receiving IV contrast, you may experience a mild flushed feeling. This is normal. On occasion, you may experience a mild rash up to 24 hours after the test. This is not dangerous. If this occurs, you can take Benadryl 25 mg and increase your fluid intake. If you experience trouble breathing, this can be serious. If it is severe call 911 IMMEDIATELY. If it is mild, please call our office. If you take any of these medications: Glipizide/Metformin, Avandament, Glucavance, please do not take 48 hours after completing test unless otherwise instructed.  We will call to schedule your test 2-4 weeks out understanding that some insurance companies will need an authorization prior to the service being performed.   For non-scheduling related questions, please contact the cardiac imaging nurse navigator should you have any questions/concerns: Rockwell Alexandria, Cardiac Imaging Nurse Navigator Larey Brick, Cardiac Imaging Nurse Navigator [facility] Direct Office Dial: [phone]   For scheduling needs, including cancellations and rescheduling, please call [place], [phone].    Follow-Up: At [facility], you and your health needs are our priority.  As part of our continuing mission to provide you with exceptional heart care, we have created designated Provider Care Teams.  These Care Teams include your primary Cardiologist (physician) and Advanced Practice Providers (APPs -  Physician Assistants and Nurse Practitioners) who all work together to provide you with the care you need, when you need  it.  We recommend signing up for the patient portal called "MyChart".  Sign up information is provided on this After Visit Summary.  MyChart is used to connect with patients for Virtual Visits (Telemedicine).  Patients are able to view lab/test results, encounter notes,  upcoming appointments, etc.  Non-urgent messages can be sent to your provider as well.   To learn more about what you can do with MyChart, go to [email or web address].    Your next appointment:   9 month(s)  The format for your next appointment:   In Person  Provider:   Jyl Heinz, MD   Other Instructions Cardiac CT Angiogram A cardiac CT angiogram is a procedure to look at the heart and the area around the heart. It may be done to help find the cause of chest pains or other symptoms of heart disease. During this procedure, a substance called contrast dye is injected into the blood vessels in the area to be checked. A large X-ray machine, called a CT scanner, then takes detailed pictures of the heart and the surrounding area. The procedure is also sometimes called a coronary CT angiogram, coronary artery scanning, or CTA. A cardiac CT angiogram allows the health care provider to see how well blood is flowing to and from the heart. The health care provider will be able to see if there are any problems, such as: Blockage or narrowing of the coronary arteries in the heart. Fluid around the heart. Signs of weakness or disease in the muscles, valves, and tissues of the heart. Tell a health care provider about: Any allergies you have. This is especially important if you have had a previous allergic reaction to contrast dye. All medicines you are taking, including vitamins, herbs, eye drops, creams, and over-the-counter medicines. Any blood disorders you have. Any surgeries you have had. Any medical conditions you have. Whether you are pregnant or may be pregnant. Any anxiety disorders, chronic pain, or other conditions you have that may increase your stress or prevent you from lying still. What are the risks? Generally, this is a safe procedure. However, problems may occur, including: Bleeding. Infection. Allergic reactions to medicines or dyes. Damage to other structures or  organs. Kidney damage from the contrast dye that is used. Increased risk of cancer from radiation exposure. This risk is low. Talk with your health care provider about: The risks and benefits of testing. How you can receive the lowest dose of radiation. What happens before the procedure? Wear comfortable clothing and remove any jewelry, glasses, dentures, and hearing aids. Follow instructions from your health care provider about eating and drinking. This may include: For 12 hours before the procedure -- avoid caffeine. This includes tea, coffee, soda, energy drinks, and diet pills. Drink plenty of water or other fluids that do not have caffeine in them. Being well hydrated can prevent complications. For 4-6 hours before the procedure -- stop eating and drinking. The contrast dye can cause nausea, but this is less likely if your stomach is empty. Ask your health care provider about changing or stopping your regular medicines. This is especially important if you are taking diabetes medicines, blood thinners, or medicines to treat problems with erections (erectile dysfunction). What happens during the procedure?  Hair on your chest may need to be removed so that small sticky patches called electrodes can be placed on your chest. These will transmit information that helps to monitor your heart during the procedure. An IV will be inserted into one of your  veins. You might be given a medicine to control your heart rate during the procedure. This will help to ensure that good images are obtained. You will be asked to lie on an exam table. This table will slide in and out of the CT machine during the procedure. Contrast dye will be injected into the IV. You might feel warm, or you may get a metallic taste in your mouth. You will be given a medicine called nitroglycerin. This will relax or dilate the arteries in your heart. The table that you are lying on will move into the CT machine tunnel for the  scan. The person running the machine will give you instructions while the scans are being done. You may be asked to: Keep your arms above your head. Hold your breath. Stay very still, even if the table is moving. When the scanning is complete, you will be moved out of the machine. The IV will be removed. The procedure may vary among health care providers and hospitals. What can I expect after the procedure? After your procedure, it is common to have: A metallic taste in your mouth from the contrast dye. A feeling of warmth. A headache from the nitroglycerin. Follow these instructions at home: Take over-the-counter and prescription medicines only as told by your health care provider. If you are told, drink enough fluid to keep your urine pale yellow. This will help to flush the contrast dye out of your body. Most people can return to their normal activities right after the procedure. Ask your health care provider what activities are safe for you. It is up to you to get the results of your procedure. Ask your health care provider, or the department that is doing the procedure, when your results will be ready. Keep all follow-up visits as told by your health care provider. This is important. Contact a health care provider if: You have any symptoms of allergy to the contrast dye. These include: Shortness of breath. Rash or hives. A racing heartbeat. Summary A cardiac CT angiogram is a procedure to look at the heart and the area around the heart. It may be done to help find the cause of chest pains or other symptoms of heart disease. During this procedure, a large X-ray machine, called a CT scanner, takes detailed pictures of the heart and the surrounding area after a contrast dye has been injected into blood vessels in the area. Ask your health care provider about changing or stopping your regular medicines before the procedure. This is especially important if you are taking diabetes  medicines, blood thinners, or medicines to treat erectile dysfunction. If you are told, drink enough fluid to keep your urine pale yellow. This will help to flush the contrast dye out of your body. This information is not intended to replace advice given to you by your health care provider. Make sure you discuss any questions you have with your health care provider. Document Revised: 09/05/2018 Document Reviewed: 09/05/2018 Elsevier Patient Education  2020 [employer].

## 2021-08-18 ENCOUNTER — Telehealth (HOSPITAL_COMMUNITY): Payer: Self-pay | Admitting: Emergency Medicine

## 2021-08-18 NOTE — Telephone Encounter (Signed)
Reaching out to patient to offer assistance regarding upcoming cardiac imaging study; pt verbalizes understanding of appt date/time, parking situation and where to check in, pre-test NPO status and medications ordered, and verified current allergies; name and call back number provided for further questions should they arise Rockwell Alexandria RN Navigator Cardiac Imaging [facility] [phone] office [phone] cell  Aware nitro 50mg  metoprolol succinate  Denies iv issues  Arrival 1100

## 2021-08-19 ENCOUNTER — Ambulatory Visit (HOSPITAL_COMMUNITY)
Admission: RE | Admit: 2021-08-19 | Discharge: 2021-08-19 | Disposition: A | Discharge status: Home / Self Care | Payer: BC Managed Care – PPO | Source: Ambulatory Visit | Attending: Cardiology | Admitting: Cardiology

## 2021-08-19 ENCOUNTER — Other Ambulatory Visit: Payer: Self-pay | Admitting: Cardiology

## 2021-08-19 DIAGNOSIS — Z01810 Encounter for preprocedural cardiovascular examination: Secondary | ICD-10-CM | POA: Insufficient documentation

## 2021-08-19 DIAGNOSIS — R0609 Other forms of dyspnea: Secondary | ICD-10-CM | POA: Diagnosis present

## 2021-08-19 DIAGNOSIS — I2510 Atherosclerotic heart disease of native coronary artery without angina pectoris: Secondary | ICD-10-CM

## 2021-08-19 DIAGNOSIS — R931 Abnormal findings on diagnostic imaging of heart and coronary circulation: Secondary | ICD-10-CM

## 2021-08-19 MED ORDER — NITROGLYCERIN 0.4 MG SL SUBL
SUBLINGUAL_TABLET | SUBLINGUAL | Status: AC
  Filled 2021-08-19: qty 2

## 2021-08-19 MED ORDER — METOPROLOL TARTRATE 5 MG/5ML IV SOLN
10.0000 mg | INTRAVENOUS | Status: DC | PRN
  Administered 2021-08-19: 10 mg via INTRAVENOUS

## 2021-08-19 MED ORDER — NITROGLYCERIN 0.4 MG SL SUBL
0.8000 mg | SUBLINGUAL_TABLET | Freq: Once | SUBLINGUAL | Status: AC
  Administered 2021-08-19: 0.8 mg via SUBLINGUAL

## 2021-08-19 MED ORDER — METOPROLOL TARTRATE 5 MG/5ML IV SOLN
INTRAVENOUS | Status: AC
  Filled 2021-08-19: qty 20

## 2021-08-19 MED ORDER — IOHEXOL 350 MG/ML SOLN
95.0000 mL | Freq: Once | INTRAVENOUS | Status: AC | PRN
  Administered 2021-08-19: 95 mL via INTRAVENOUS

## 2021-08-19 NOTE — Progress Notes (Unsigned)
FFR Order 

## 2021-08-20 ENCOUNTER — Ambulatory Visit (HOSPITAL_BASED_OUTPATIENT_CLINIC_OR_DEPARTMENT_OTHER)
Admission: RE | Admit: 2021-08-20 | Discharge: 2021-08-20 | Disposition: A | Discharge status: Home / Self Care | Payer: BC Managed Care – PPO | Source: Ambulatory Visit | Attending: Cardiology | Admitting: Cardiology

## 2021-08-20 DIAGNOSIS — R931 Abnormal findings on diagnostic imaging of heart and coronary circulation: Secondary | ICD-10-CM

## 2021-08-25 ENCOUNTER — Telehealth: Payer: Self-pay | Admitting: Cardiology

## 2021-08-25 ENCOUNTER — Ambulatory Visit: Payer: BC Managed Care – PPO | Admitting: Cardiology

## 2021-08-25 ENCOUNTER — Encounter: Payer: Self-pay | Admitting: Cardiology

## 2021-08-25 VITALS — BP 116/60 | HR 72 | Ht 63.0 in | Wt 151.2 lb

## 2021-08-25 DIAGNOSIS — I10 Essential (primary) hypertension: Secondary | ICD-10-CM | POA: Diagnosis not present

## 2021-08-25 DIAGNOSIS — F17210 Nicotine dependence, cigarettes, uncomplicated: Secondary | ICD-10-CM | POA: Diagnosis not present

## 2021-08-25 DIAGNOSIS — I2510 Atherosclerotic heart disease of native coronary artery without angina pectoris: Secondary | ICD-10-CM

## 2021-08-25 DIAGNOSIS — E782 Mixed hyperlipidemia: Secondary | ICD-10-CM | POA: Diagnosis not present

## 2021-08-25 DIAGNOSIS — I209 Angina pectoris, unspecified: Secondary | ICD-10-CM

## 2021-08-25 HISTORY — DX: Angina pectoris, unspecified: I20.9

## 2021-08-25 HISTORY — DX: Atherosclerotic heart disease of native coronary artery without angina pectoris: I25.10

## 2021-08-25 NOTE — Patient Instructions (Signed)
Medication Instructions:  Your physician has recommended you make the following change in your medication:   Take 81 mg coated aspirin daily. Use nitroglycerin as needed for chest pain.  *If you need a refill on your cardiac medications before your next appointment, please call your pharmacy*   Lab Work: Your physician recommends that you have a BMET and CBC today in the office for your upcoming procedure.  If you have labs (blood work) drawn today and your tests are completely normal, you will receive your results only by: MyChart Message (if you have MyChart) OR A paper copy in the mail If you have any lab test that is abnormal or we need to change your treatment, we will call you to review the results.   Testing/Procedures:  [facility]ARD[facility] [place] [address] [place] [address] Dept: [phone] Loc: [phone]  BRIDGET Kimball  08/25/2021  You are scheduled for a Cardiac Catheterization on Friday, August 4 with Dr. Tonny Bollman.  1. Please arrive at the Main Entrance A at [facility]: [address] [place], [place] [address] at 7:00 AM (This time is two hours before your procedure to ensure your preparation). Free valet parking service is available.   Special note: Every effort is made to have your procedure done on time. Please understand that emergencies sometimes delay scheduled procedures.  2. Diet: Do not eat solid foods after midnight.  You may have clear liquids until 5 AM upon the day of the procedure.  3. Labs: You had labs done today in the office.  4. Medication instructions in preparation for your procedure:   Contrast Allergy: No  Stop taking, Lisinopril-Hydrochlorothiazide Friday, August 4,, Advil or Motrin (Ibuprofen) Friday, August 4,  On the morning of your procedure, take Aspirin and any morning medicines NOT listed above.  You may use sips of water.  5. Plan to  go home the same day, you will only stay overnight if medically necessary. 6. You MUST have a responsible adult to drive you home. 7. An adult MUST be with you the first 24 hours after you arrive home. 8. Bring a current list of your medications, and the last time and date medication taken. 9. Bring ID and current insurance cards. 10.Please wear clothes that are easy to get on and off and wear slip-on shoes.  Thank you for allowing [place] to care for you!   -- [facility]    Follow-Up: At [facility], you and your health needs are our priority.  As part of our continuing mission to provide you with exceptional heart care, we have created designated Provider Care Teams.  These Care Teams include your primary Cardiologist (physician) and Advanced Practice Providers (APPs -  Physician Assistants and Nurse Practitioners) who all work together to provide you with the care you need, when you need it.  We recommend signing up for the patient portal called "MyChart".  Sign up information is provided on this After Visit Summary.  MyChart is used to connect with patients for Virtual Visits (Telemedicine).  Patients are able to view lab/test results, encounter notes, upcoming appointments, etc.  Non-urgent messages can be sent to your provider as well.   To learn more about what you can do with MyChart, go to [email or web address].    Your next appointment:   6 month(s)  The format for your next appointment:   In Person  Provider:   Glean Hess  Revankar, MD   Other Instructions  Coronary Angiogram With Stent Coronary angiogram with stent placement is a procedure to widen or open a narrow blood vessel of the heart (coronary artery). Arteries may become blocked by cholesterol buildup (plaques) in the lining of the artery wall. When a coronary artery becomes partially blocked, blood flow to that area decreases. This may lead to chest pain or a heart attack (myocardial  infarction). A stent is a small piece of metal that looks like mesh or spring. Stent placement may be done as treatment after a heart attack, or to prevent a heart attack if a blocked artery is found by a coronary angiogram. Let your health care provider know about: Any allergies you have, including allergies to medicines or contrast dye. All medicines you are taking, including vitamins, herbs, eye drops, creams, and over-the-counter medicines. Any problems you or family members have had with anesthetic medicines. Any blood disorders you have. Any surgeries you have had. Any medical conditions you have, including kidney problems or kidney failure. Whether you are pregnant or may be pregnant. Whether you are breastfeeding. What are the risks? Generally, this is a safe procedure. However, serious problems may occur, including: Damage to nearby structures or organs, such as the heart, blood vessels, or kidneys. A return of blockage. Bleeding, infection, or bruising at the insertion site. A collection of blood under the skin (hematoma) at the insertion site. A blood clot in another part of the body. Allergic reaction to medicines or dyes. Bleeding into the abdomen (retroperitoneal bleeding). Stroke (rare). Heart attack (rare). What happens before the procedure? Staying hydrated Follow instructions from your health care provider about hydration, which may include: Up to 2 hours before the procedure - you may continue to drink clear liquids, such as water, clear fruit juice, black coffee, and plain tea.    Eating and drinking restrictions Follow instructions from your health care provider about eating and drinking, which may include: 8 hours before the procedure - stop eating heavy meals or foods, such as meat, fried foods, or fatty foods. 6 hours before the procedure - stop eating light meals or foods, such as toast or cereal. 2 hours before the procedure - stop drinking clear  liquids. Medicines Ask your health care provider about: Changing or stopping your regular medicines. This is especially important if you are taking diabetes medicines or blood thinners. Taking medicines such as aspirin and ibuprofen. These medicines can thin your blood. Do not take these medicines unless your health care provider tells you to take them. Generally, aspirin is recommended before a thin tube, called a catheter, is passed through a blood vessel and inserted into the heart (cardiac catheterization). Taking over-the-counter medicines, vitamins, herbs, and supplements. General instructions Do not use any products that contain nicotine or tobacco for at least 4 weeks before the procedure. These products include cigarettes, e-cigarettes, and chewing tobacco. If you need help quitting, ask your health care provider. Plan to have someone take you home from the hospital or clinic. If you will be going home right after the procedure, plan to have someone with you for 24 hours. You may have tests and imaging procedures. Ask your health care provider: How your insertion site will be marked. Ask which artery will be used for the procedure. What steps will be taken to help prevent infection. These may include: Removing hair at the insertion site. Washing skin with a germ-killing soap. Taking antibiotic medicine. What happens during the procedure? An IV  will be inserted into one of your veins. Electrodes may be placed on your chest to monitor your heart rate during the procedure. You will be given one or more of the following: A medicine to help you relax (sedative). A medicine to numb the area (local anesthetic) for catheter insertion. A small incision will be made for catheter insertion. The catheter will be inserted into an artery using a guide wire. The location may be in your groin, your wrist, or the fold of your arm (near your elbow). An X-ray procedure (fluoroscopy) will be used to  help guide the catheter to the opening of the heart arteries. A dye will be injected into the catheter. X-rays will be taken. The dye helps to show where any narrowing or blockages are located in the arteries. Tell your health care provider if you have chest pain or trouble breathing. A tiny wire will be guided to the blocked spot, and a balloon will be inflated to make the artery wider. The stent will be expanded to crush the plaques into the wall of the vessel. The stent will hold the area open and improve the blood flow. Most stents have a drug coating to reduce the risk of the stent narrowing over time. The artery may be made wider using a drill, laser, or other tools that remove plaques. The catheter will be removed when the blood flow improves. The stent will stay where it was placed, and the lining of the artery will grow over it. A bandage (dressing) will be placed on the insertion site. Pressure will be applied to stop bleeding. The IV will be removed. This procedure may vary among health care providers and hospitals.    What happens after the procedure? Your blood pressure, heart rate, breathing rate, and blood oxygen level will be monitored until you leave the hospital or clinic. If the procedure is done through the leg, you will lie flat in bed for a few hours or for as long as told by your health care provider. You will be instructed not to bend or cross your legs. The insertion site and the pulse in your foot or wrist will be checked often. You may have more blood tests, X-rays, and a test that records the electrical activity of your heart (electrocardiogram, or ECG). Do not drive for 24 hours if you were given a sedative during your procedure. Summary Coronary angiogram with stent placement is a procedure to widen or open a narrowed coronary artery. This is done to treat heart problems. Before the procedure, let your health care provider know about all the medical conditions and  surgeries you have or have had. This is a safe procedure. However, some problems may occur, including damage to nearby structures or organs, bleeding, blood clots, or allergies. Follow your health care provider's instructions about eating, drinking, medicines, and other lifestyle changes, such as quitting tobacco use before the procedure. This information is not intended to replace advice given to you by your health care provider. Make sure you discuss any questions you have with your health care provider. Document Revised: 08/01/2018 Document Reviewed: 08/01/2018 Elsevier Patient Education  2021 Elsevier Inc.  Aspirin and Your Heart Aspirin is a medicine that prevents the platelets in your blood from sticking together. Platelets are the cells that your blood uses for clotting. Aspirin can be used to help reduce the risk of blood clots, heart attacks, and other heart-related problems. What are the risks? Daily use of aspirin can cause side  effects. Some of these include: Bleeding. Bleeding can be minor or serious. An example of minor bleeding is bleeding from a cut, and the bleeding does not stop. An example of more serious bleeding is stomach bleeding or, rarely, bleeding into the brain. Your risk of bleeding increases if you are also taking NSAIDs, such as ibuprofen. Increased bruising. Upset stomach. An allergic reaction. People who have growths inside the nose (nasal polyps) have an increased risk of developing an aspirin allergy. How to use aspirin to care for your heart Take aspirin only as told by your health care provider. Make sure that you understand how much to take and what form to take. The two forms of aspirin are: Non-enteric-coated.This type of aspirin does not have a coating and is absorbed quickly. This type of aspirin also comes in a chewable form. Enteric-coated. This type of aspirin has a coating that releases the medicine very slowly. Enteric-coated aspirin might cause less  stomach upset than non-enteric-coated aspirin. This type of aspirin should not be chewed or crushed. Work with your health care provider to find out whether it is safe and beneficial for you to take aspirin daily. Taking aspirin daily may be helpful if: You have had a heart attack or chest pain, or you are at risk for a heart attack. You have a condition in which certain heart vessels are blocked (coronary artery disease), and you have had a procedure to treat it. Examples are: Open-heart surgery, such as coronary artery bypass surgery (CABG). Coronary angioplasty,which is done to widen a blood vessel of your heart. Having a small mesh tube, or stent, placed in your coronary artery. You have had certain types of stroke or a mini-stroke known as a transient ischemic attack (TIA). You have a narrowing of the arteries that supply the limbs (peripheral artery disease, or PAD). You have long-term (chronic) heart rhythm problems, such as atrial fibrillation, and your health care provider thinks aspirin may help. You have valve disease or have had surgery on a valve. You are considered at increased risk of developing coronary artery disease or PAD.    Follow these instructions at home Medicines Take over-the-counter and prescription medicines only as told by your health care provider. If you are taking blood thinners: Talk with your health care provider before you take any medicines that contain aspirin or NSAIDs, such as ibuprofen. These medicines increase your risk for dangerous bleeding. Take your medicine exactly as told, at the same time every day. Avoid activities that could cause injury or bruising, and follow instructions about how to prevent falls. Wear a medical alert bracelet or carry a card that lists what medicines you take. General instructions Do not drink alcohol if: Your health care provider tells you not to drink. You are pregnant, may be pregnant, or are planning to become  pregnant. If you drink alcohol: Limit how much you use to: 0-1 drink a day for women. 0-2 drinks a day for men. Be aware of how much alcohol is in your drink. In the [place] one drink equals one 12 oz bottle of beer (355 mL), one 5 oz glass of wine (148 mL), or one 1 oz glass of hard liquor (44 mL). Keep all follow-up visits as told by your health care provider. This is important. Where to find more information The American Heart Association: www.heart.org Contact a health care provider if you have: Unusual bleeding or bruising. Stomach pain or nausea. Ringing in your ears. An allergic reaction that causes  hives, itchy skin, or swelling of the lips, tongue, or face. Get help right away if: You notice that your bowel movements are bloody, or dark red or black in color. You vomit or cough up blood. You have blood in your urine. You cough, breathe loudly (wheeze), or feel short of breath. You have chest pain, especially if the pain spreads to your arms, back, neck, or jaw. You have a headache with confusion. You have any symptoms of a stroke. "BE FAST" is an easy way to remember the main warning signs of a stroke: B - Balance. Signs are dizziness, sudden trouble walking, or loss of balance. E - Eyes. Signs are trouble seeing or a sudden change in vision. F - Face. Signs are sudden weakness or numbness of the face, or the face or eyelid drooping on one side. A - Arms. Signs are weakness or numbness in an arm. This happens suddenly and usually on one side of the body. S - Speech. Signs are sudden trouble speaking, slurred speech, or trouble understanding what people say. T - Time. Time to call emergency services. Write down what time symptoms started. You have other signs of a stroke, such as: A sudden, severe headache with no known cause. Nausea or vomiting. Seizure. These symptoms may represent a serious problem that is an emergency. Do not wait to see if the symptoms will go away. Get  medical help right away. Call your local emergency services (911 in the [place] Do not drive yourself to the hospital. Summary Aspirin use can help reduce the risk of blood clots, heart attacks, and other heart-related problems. Daily use of aspirin can cause side effects. Take aspirin only as told by your health care provider. Make sure that you understand how much to take and what form to take. Your health care provider will help you determine whether it is safe and beneficial for you to take aspirin daily. This information is not intended to replace advice given to you by your health care provider. Make sure you discuss any questions you have with your health care provider. Document Revised: 10/15/2018 Document Reviewed: 10/15/2018 Elsevier Patient Education  2021 Elsevier Inc. Nitroglycerin sublingual tablets What is this medicine? NITROGLYCERIN (nye troe GLI ser in) is a type of vasodilator. It relaxes blood vessels, increasing the blood and oxygen supply to your heart. This medicine is used to relieve chest pain caused by angina. It is also used to prevent chest pain before activities like climbing stairs, going outdoors in cold weather, or sexual activity. This medicine may be used for other purposes; ask your health care provider or pharmacist if you have questions. COMMON BRAND NAME(S): Nitroquick, Nitrostat, Nitrotab What should I tell my health care provider before I take this medicine? They need to know if you have any of these conditions: anemia head injury, recent stroke, or bleeding in the brain liver disease previous heart attack an unusual or allergic reaction to nitroglycerin, other medicines, foods, dyes, or preservatives pregnant or trying to get pregnant breast-feeding How should I use this medicine? Take this medicine by mouth as needed. Use at the first sign of an angina attack (chest pain or tightness). You can also take this medicine 5 to 10 minutes before an event  likely to produce chest pain. Follow the directions exactly as written on the prescription label. Place one tablet under your tongue and let it dissolve. Do not swallow whole. Replace the dose if you accidentally swallow it. It will help if  your mouth is not dry. Saliva around the tablet will help it to dissolve more quickly. Do not eat or drink, smoke or chew tobacco while a tablet is dissolving. Sit down when taking this medicine. In an angina attack, you should feel better within 5 minutes after your first dose. You can take a dose every 5 minutes up to a total of 3 doses. If you do not feel better or feel worse after 1 dose, call 9-1-1 at once. Do not take more than 3 doses in 15 minutes. Your health care provider might give you other directions. Follow those directions if he or she does. Do not take your medicine more often than directed. Talk to your health care provider about the use of this medicine in children. Special care may be needed. Overdosage: If you think you have taken too much of this medicine contact a poison control center or emergency room at once. NOTE: This medicine is only for you. Do not share this medicine with others. What if I miss a dose? This does not apply. This medicine is only used as needed. What may interact with this medicine? Do not take this medicine with any of the following medications: certain migraine medicines like ergotamine and dihydroergotamine (DHE) medicines used to treat erectile dysfunction like sildenafil, tadalafil, and vardenafil riociguat This medicine may also interact with the following medications: alteplase aspirin heparin medicines for high blood pressure medicines for mental depression other medicines used to treat angina phenothiazines like chlorpromazine, mesoridazine, prochlorperazine, thioridazine This list may not describe all possible interactions. Give your health care provider a list of all the medicines, herbs, non-prescription  drugs, or dietary supplements you use. Also tell them if you smoke, drink alcohol, or use illegal drugs. Some items may interact with your medicine. What should I watch for while using this medicine? Tell your doctor or health care professional if you feel your medicine is no longer working. Keep this medicine with you at all times. Sit or lie down when you take your medicine to prevent falling if you feel dizzy or faint after using it. Try to remain calm. This will help you to feel better faster. If you feel dizzy, take several deep breaths and lie down with your feet propped up, or bend forward with your head resting between your knees. You may get drowsy or dizzy. Do not drive, use machinery, or do anything that needs mental alertness until you know how this drug affects you. Do not stand or sit up quickly, especially if you are an older patient. This reduces the risk of dizzy or fainting spells. Alcohol can make you more drowsy and dizzy. Avoid alcoholic drinks. Do not treat yourself for coughs, colds, or pain while you are taking this medicine without asking your doctor or health care professional for advice. Some ingredients may increase your blood pressure. What side effects may I notice from receiving this medicine? Side effects that you should report to your doctor or health care professional as soon as possible: allergic reactions (skin rash, itching or hives; swelling of the face, lips, or tongue) low blood pressure (dizziness; feeling faint or lightheaded, falls; unusually weak or tired) low red blood cell counts (trouble breathing; feeling faint; lightheaded, falls; unusually weak or tired) Side effects that usually do not require medical attention (report to your doctor or health care professional if they continue or are bothersome): facial flushing (redness) headache nausea, vomiting This list may not describe all possible side effects. Call your  doctor for medical advice about side  effects. You may report side effects to FDA at 1-800-FDA-1088. Where should I keep my medicine? Keep out of the reach of children. Store at room temperature between 20 and 25 degrees C (68 and 77 degrees F). Store in Retail buyer. Protect from light and moisture. Keep tightly closed. Throw away any unused medicine after the expiration date. NOTE: This sheet is a summary. It may not cover all possible information. If you have questions about this medicine, talk to your doctor, pharmacist, or health care provider.  2021 Elsevier/Gold Standard (2017-10-11 16:46:32)

## 2021-08-25 NOTE — H&P (View-Only) (Signed)
Cardiology Office Note:    Date:  08/25/2021   ID:  Kathryn Holmes, DOB 1965-03-18, MRN [id number]  PCP:  Lucianne Lei, MD  Cardiologist:  Garwin Brothers, MD   Referring MD: Lucianne Lei, MD    ASSESSMENT:    1. Essential hypertension   2. Coronary artery disease involving native coronary artery of native heart without angina pectoris   3. Mixed hyperlipidemia   4. Continuous dependence on cigarette smoking   5. Angina pectoris (HCC)    PLAN:    In order of problems listed above:  Coronary artery disease and angina pectoris: Patient's symptoms are concerning.  Following recommendations were made for the patient.I discussed coronary angiography and left heart catheterization with the patient at extensive length. Procedure, benefits and potential risks were explained. Patient had multiple questions which were answered to the patient's satisfaction. Patient agreed and consented for the procedure. Further recommendations will be made based on the findings of the coronary angiography. In the interim. The patient has any significant symptoms he knows to go to the nearest emergency room. Essential hypertension: Blood pressure stable and diet was emphasized. Cigarette smoker: I spent 5 minutes with the patient discussing solely about smoking. Smoking cessation was counseled. I suggested to the patient also different medications and pharmacological interventions. Patient is keen to try stopping on its own at this time. He will get back to me if he needs any further assistance in this matter. Mixed dyslipidemia: Diet was emphasized.  Lipids were reviewed and we will recheck this after coronary angiography. She will be seen in follow-up appointment after coronary angiography.   Medication Adjustments/Labs and Tests Ordered: Current medicines are reviewed at length with the patient today.  Concerns regarding medicines are outlined above.  No orders of the defined types were placed in this  encounter.  No orders of the defined types were placed in this encounter.    No chief complaint on file.    History of Present Illness:    Kathryn Holmes is a 56 y.o. female.  Patient had CT coronary angiography and this revealed significant stenosis.  I am not sure whether it was obstructive however the patient is having recurrent angina on exertion and is under significant amount of domestic stress.  Unfortunately she continues to smoke.  She has history of hypertension and dyslipidemia.  At the time of my evaluation, the patient is alert awake oriented and in no distress.  Past Medical History:  Diagnosis Date   Allergic rhinitis    Anxiety and depression    Attention and concentration deficit 07/28/2021   Candidal vulvovaginitis 07/28/2021   Chest discomfort 12/07/2018   Chronic fatigue, unspecified 07/28/2021   Continuous dependence on cigarette smoking 12/26/2016   DOE (dyspnea on exertion) 12/07/2018   Elevated LFTs    Essential hypertension    GERD (gastroesophageal reflux disease) 12/23/2016   History of cardiomyopathy 12/26/2016   Hyperlipidemia 12/23/2016   Hypertension    Ischemic heart disease    Lumbar pain 10/03/2018   Mixed hyperlipidemia 07/28/2021   Moderate recurrent major depression (HCC) 07/28/2021   Paronychia of finger of right hand 10/31/2019   Preoperative cardiovascular examination 12/26/2016   Rotator cuff tear    Left shoulder   Trigger finger    Vitamin D deficiency 12/23/2016    Past Surgical History:  Procedure Laterality Date   APPENDECTOMY      Current Medications: Current Meds  Medication Sig   albuterol (VENTOLIN HFA) 108 (90 Base)  MCG/ACT inhaler Inhale 1 puff into the lungs every 6 (six) hours as needed for wheezing or shortness of breath.   Ascorbic Acid (VITAMIN C) 1000 MG tablet Take 1,000 mg by mouth daily.   aspirin EC 81 MG tablet Take 81 mg by mouth daily.   atorvastatin (LIPITOR) 80 MG tablet Take 80 mg by mouth daily.    cyanocobalamin (,VITAMIN B-12,) 1000 MCG/ML injection Inject 1,000 mcg into the muscle every 30 (thirty) days.   ergocalciferol (VITAMIN D2) 1.25 MG (50000 UT) capsule Take 50,000 Units by mouth once a week.   fluticasone (FLONASE) 50 MCG/ACT nasal spray Place 1 spray into both nostrils as needed for rhinitis or allergies.   ibuprofen (ADVIL) 800 MG tablet Take 800 mg by mouth 3 (three) times daily as needed for pain.   lisinopril-hydrochlorothiazide (ZESTORETIC) 10-12.5 MG tablet Take 1 tablet by mouth daily.   metoprolol succinate (TOPROL-XL) 50 MG 24 hr tablet Take 50 mg by mouth daily. Take with or immediately following a meal.   mirabegron ER (MYRBETRIQ) 50 MG TB24 tablet Take 50 mg by mouth daily.   montelukast (SINGULAIR) 10 MG tablet Take 10 mg by mouth at bedtime.   Multiple Vitamin (MULTIVITAMIN) capsule Take 1 capsule by mouth daily.   nitroGLYCERIN (NITROSTAT) 0.4 MG SL tablet Place 0.4 mg under the tongue every 5 (five) minutes as needed for chest pain.   olopatadine (PATANOL) 0.1 % ophthalmic solution Place 1 drop into both eyes 3 (three) times daily.   Omega-3 Fatty Acids (FISH OIL) 1000 MG CAPS Take 1,000 mg by mouth 2 (two) times daily.   omeprazole (PRILOSEC) 40 MG capsule Take 40 mg by mouth daily.   promethazine (PHENERGAN) 12.5 MG tablet Take 12.5 mg by mouth every 6 (six) hours as needed for nausea/vomiting.   sertraline (ZOLOFT) 100 MG tablet Take 100 mg by mouth daily.   sulfamethoxazole-trimethoprim (BACTRIM DS) 800-160 MG tablet Take 1 tablet by mouth 2 (two) times daily.   traMADol (ULTRAM) 50 MG tablet Take 50 mg by mouth as needed for pain.   VYVANSE 70 MG capsule Take 70 mg by mouth every morning.     Allergies:   Patient has no known allergies.   Social History   Socioeconomic History   Marital status: Married    Spouse name: Not on file   Number of children: Not on file   Years of education: Not on file   Highest education level: Not on file  Occupational  History   Not on file  Tobacco Use   Smoking status: Every Day   Smokeless tobacco: Never  Substance and Sexual Activity   Alcohol use: No   Drug use: No   Sexual activity: Not on file  Other Topics Concern   Not on file  Social History Narrative   Not on file   Social Determinants of Health   Financial Resource Strain: Not on file  Food Insecurity: Not on file  Transportation Needs: Not on file  Physical Activity: Not on file  Stress: Not on file  Social Connections: Not on file     Family History: The patient's family history includes Heart attack in her father; Hypercholesterolemia in her father; Hypertension in her brother, mother, and sister.  ROS:   Please see the history of present illness.    All other systems reviewed and are negative.  EKGs/Labs/Other Studies Reviewed:    The following studies were reviewed today: I discussed my findings with the patient at length.    EKG revealed sinus rhythm and nonspecific ST changes   Recent Labs: No results found for requested labs within last 365 days.  Recent Lipid Panel No results found for: "CHOL", "TRIG", "HDL", "CHOLHDL", "VLDL", "LDLCALC", "LDLDIRECT"  Physical Exam:    VS:  BP 116/60   Pulse 72   Ht 5\' 3"  (1.6 m)   Wt 151 lb 3.2 oz (68.6 kg)   SpO2 97%   BMI 26.78 kg/m     Wt Readings from Last 3 Encounters:  08/25/21 151 lb 3.2 oz (68.6 kg)  07/28/21 157 lb 3.2 oz (71.3 kg)  06/19/19 152 lb 3.2 oz (69 kg)     GEN: Patient is in no acute distress HEENT: Normal NECK: No JVD; No carotid bruits LYMPHATICS: No lymphadenopathy CARDIAC: Hear sounds regular, 2/6 systolic murmur at the apex. RESPIRATORY:  Clear to auscultation without rales, wheezing or rhonchi  ABDOMEN: Soft, non-tender, non-distended MUSCULOSKELETAL:  No edema; No deformity  SKIN: Warm and dry NEUROLOGIC:  Alert and oriented x 3 PSYCHIATRIC:  Normal affect   Signed, 06/21/19, MD  08/25/2021 2:49 PM    [facility]

## 2021-08-25 NOTE — Telephone Encounter (Signed)
Appointment made for pt to see Dr. Tomie China today.

## 2021-08-25 NOTE — Telephone Encounter (Signed)
Patient calling in because the CT department was suppose to call our office, to get her appt set up asap base off her results. Please advise

## 2021-08-25 NOTE — Addendum Note (Signed)
Addended by: Eleonore Chiquito on: 08/25/2021 02:59 PM   Modules accepted: Orders

## 2021-08-25 NOTE — Progress Notes (Signed)
Cardiology Office Note:    Date:  08/25/2021   ID:  Kathryn Holmes, DOB 1965-03-18, MRN [id number]  PCP:  Lucianne Lei, MD  Cardiologist:  Garwin Brothers, MD   Referring MD: Lucianne Lei, MD    ASSESSMENT:    1. Essential hypertension   2. Coronary artery disease involving native coronary artery of native heart without angina pectoris   3. Mixed hyperlipidemia   4. Continuous dependence on cigarette smoking   5. Angina pectoris (HCC)    PLAN:    In order of problems listed above:  Coronary artery disease and angina pectoris: Patient's symptoms are concerning.  Following recommendations were made for the patient.I discussed coronary angiography and left heart catheterization with the patient at extensive length. Procedure, benefits and potential risks were explained. Patient had multiple questions which were answered to the patient's satisfaction. Patient agreed and consented for the procedure. Further recommendations will be made based on the findings of the coronary angiography. In the interim. The patient has any significant symptoms he knows to go to the nearest emergency room. Essential hypertension: Blood pressure stable and diet was emphasized. Cigarette smoker: I spent 5 minutes with the patient discussing solely about smoking. Smoking cessation was counseled. I suggested to the patient also different medications and pharmacological interventions. Patient is keen to try stopping on its own at this time. He will get back to me if he needs any further assistance in this matter. Mixed dyslipidemia: Diet was emphasized.  Lipids were reviewed and we will recheck this after coronary angiography. She will be seen in follow-up appointment after coronary angiography.   Medication Adjustments/Labs and Tests Ordered: Current medicines are reviewed at length with the patient today.  Concerns regarding medicines are outlined above.  No orders of the defined types were placed in this  encounter.  No orders of the defined types were placed in this encounter.    No chief complaint on file.    History of Present Illness:    Kathryn Holmes is a 56 y.o. female.  Patient had CT coronary angiography and this revealed significant stenosis.  I am not sure whether it was obstructive however the patient is having recurrent angina on exertion and is under significant amount of domestic stress.  Unfortunately she continues to smoke.  She has history of hypertension and dyslipidemia.  At the time of my evaluation, the patient is alert awake oriented and in no distress.  Past Medical History:  Diagnosis Date   Allergic rhinitis    Anxiety and depression    Attention and concentration deficit 07/28/2021   Candidal vulvovaginitis 07/28/2021   Chest discomfort 12/07/2018   Chronic fatigue, unspecified 07/28/2021   Continuous dependence on cigarette smoking 12/26/2016   DOE (dyspnea on exertion) 12/07/2018   Elevated LFTs    Essential hypertension    GERD (gastroesophageal reflux disease) 12/23/2016   History of cardiomyopathy 12/26/2016   Hyperlipidemia 12/23/2016   Hypertension    Ischemic heart disease    Lumbar pain 10/03/2018   Mixed hyperlipidemia 07/28/2021   Moderate recurrent major depression (HCC) 07/28/2021   Paronychia of finger of right hand 10/31/2019   Preoperative cardiovascular examination 12/26/2016   Rotator cuff tear    Left shoulder   Trigger finger    Vitamin D deficiency 12/23/2016    Past Surgical History:  Procedure Laterality Date   APPENDECTOMY      Current Medications: Current Meds  Medication Sig   albuterol (VENTOLIN HFA) 108 (90 Base)  MCG/ACT inhaler Inhale 1 puff into the lungs every 6 (six) hours as needed for wheezing or shortness of breath.   Ascorbic Acid (VITAMIN C) 1000 MG tablet Take 1,000 mg by mouth daily.   aspirin EC 81 MG tablet Take 81 mg by mouth daily.   atorvastatin (LIPITOR) 80 MG tablet Take 80 mg by mouth daily.    cyanocobalamin (,VITAMIN B-12,) 1000 MCG/ML injection Inject 1,000 mcg into the muscle every 30 (thirty) days.   ergocalciferol (VITAMIN D2) 1.25 MG (50000 UT) capsule Take 50,000 Units by mouth once a week.   fluticasone (FLONASE) 50 MCG/ACT nasal spray Place 1 spray into both nostrils as needed for rhinitis or allergies.   ibuprofen (ADVIL) 800 MG tablet Take 800 mg by mouth 3 (three) times daily as needed for pain.   lisinopril-hydrochlorothiazide (ZESTORETIC) 10-12.5 MG tablet Take 1 tablet by mouth daily.   metoprolol succinate (TOPROL-XL) 50 MG 24 hr tablet Take 50 mg by mouth daily. Take with or immediately following a meal.   mirabegron ER (MYRBETRIQ) 50 MG TB24 tablet Take 50 mg by mouth daily.   montelukast (SINGULAIR) 10 MG tablet Take 10 mg by mouth at bedtime.   Multiple Vitamin (MULTIVITAMIN) capsule Take 1 capsule by mouth daily.   nitroGLYCERIN (NITROSTAT) 0.4 MG SL tablet Place 0.4 mg under the tongue every 5 (five) minutes as needed for chest pain.   olopatadine (PATANOL) 0.1 % ophthalmic solution Place 1 drop into both eyes 3 (three) times daily.   Omega-3 Fatty Acids (FISH OIL) 1000 MG CAPS Take 1,000 mg by mouth 2 (two) times daily.   omeprazole (PRILOSEC) 40 MG capsule Take 40 mg by mouth daily.   promethazine (PHENERGAN) 12.5 MG tablet Take 12.5 mg by mouth every 6 (six) hours as needed for nausea/vomiting.   sertraline (ZOLOFT) 100 MG tablet Take 100 mg by mouth daily.   sulfamethoxazole-trimethoprim (BACTRIM DS) 800-160 MG tablet Take 1 tablet by mouth 2 (two) times daily.   traMADol (ULTRAM) 50 MG tablet Take 50 mg by mouth as needed for pain.   VYVANSE 70 MG capsule Take 70 mg by mouth every morning.     Allergies:   Patient has no known allergies.   Social History   Socioeconomic History   Marital status: Married    Spouse name: Not on file   Number of children: Not on file   Years of education: Not on file   Highest education level: Not on file  Occupational  History   Not on file  Tobacco Use   Smoking status: Every Day   Smokeless tobacco: Never  Substance and Sexual Activity   Alcohol use: No   Drug use: No   Sexual activity: Not on file  Other Topics Concern   Not on file  Social History Narrative   Not on file   Social Determinants of Health   Financial Resource Strain: Not on file  Food Insecurity: Not on file  Transportation Needs: Not on file  Physical Activity: Not on file  Stress: Not on file  Social Connections: Not on file     Family History: The patient's family history includes Heart attack in her father; Hypercholesterolemia in her father; Hypertension in her brother, mother, and sister.  ROS:   Please see the history of present illness.    All other systems reviewed and are negative.  EKGs/Labs/Other Studies Reviewed:    The following studies were reviewed today: I discussed my findings with the patient at length.  EKG revealed sinus rhythm and nonspecific ST changes   Recent Labs: No results found for requested labs within last 365 days.  Recent Lipid Panel No results found for: "CHOL", "TRIG", "HDL", "CHOLHDL", "VLDL", "LDLCALC", "LDLDIRECT"  Physical Exam:    VS:  BP 116/60   Pulse 72   Ht 5\' 3"  (1.6 m)   Wt 151 lb 3.2 oz (68.6 kg)   SpO2 97%   BMI 26.78 kg/m     Wt Readings from Last 3 Encounters:  08/25/21 151 lb 3.2 oz (68.6 kg)  07/28/21 157 lb 3.2 oz (71.3 kg)  06/19/19 152 lb 3.2 oz (69 kg)     GEN: Patient is in no acute distress HEENT: Normal NECK: No JVD; No carotid bruits LYMPHATICS: No lymphadenopathy CARDIAC: Hear sounds regular, 2/6 systolic murmur at the apex. RESPIRATORY:  Clear to auscultation without rales, wheezing or rhonchi  ABDOMEN: Soft, non-tender, non-distended MUSCULOSKELETAL:  No edema; No deformity  SKIN: Warm and dry NEUROLOGIC:  Alert and oriented x 3 PSYCHIATRIC:  Normal affect   Signed, 06/21/19, MD  08/25/2021 2:49 PM    [facility]

## 2021-08-26 ENCOUNTER — Telehealth: Payer: Self-pay | Admitting: *Deleted

## 2021-08-26 LAB — CBC
Hematocrit: 43 % (ref 34.0–46.6)
Hemoglobin: 14.6 g/dL (ref 11.1–15.9)
MCH: 32.1 pg (ref 26.6–33.0)
MCHC: 34 g/dL (ref 31.5–35.7)
MCV: 95 fL (ref 79–97)
Platelets: 381 10*3/uL (ref 150–450)
RBC: 4.55 x10E6/uL (ref 3.77–5.28)
RDW: 12.9 % (ref 11.7–15.4)
WBC: 5.6 10*3/uL (ref 3.4–10.8)

## 2021-08-26 LAB — BASIC METABOLIC PANEL
BUN/Creatinine Ratio: 14 (ref 9–23)
BUN: 14 mg/dL (ref 6–24)
CO2: 19 mmol/L — ABNORMAL LOW (ref 20–29)
Calcium: 9.8 mg/dL (ref 8.7–10.2)
Chloride: 102 mmol/L (ref 96–106)
Creatinine, Ser: 1.02 mg/dL — ABNORMAL HIGH (ref 0.57–1.00)
Glucose: 88 mg/dL (ref 70–99)
Potassium: 4.6 mmol/L (ref 3.5–5.2)
Sodium: 139 mmol/L (ref 134–144)
eGFR: 65 mL/min/{1.73_m2} (ref >59)

## 2021-08-26 NOTE — Telephone Encounter (Signed)
Cardiac Catheterization scheduled at [facility] for: Friday August 27, 2021 9 AM Arrival time and place: [facility] Main Entrance A at: 7 AM   Nothing to eat after midnight prior to procedure, clear liquids until 5 AM day of procedure.  Medication instructions: -Hold:  Lisinopril/HCT-AM of procedure -Except hold medications usual morning medications can be taken with sips of water including aspirin 81 mg.  Confirmed patient has responsible adult to drive home post procedure and be with patient first 24 hours after arriving home.  Patient reports no new symptoms concerning for COVID-19 in the past 10 days.  Reviewed procedure instructions with patient.

## 2021-08-27 ENCOUNTER — Encounter (HOSPITAL_COMMUNITY): Payer: Self-pay | Admitting: Cardiovascular Disease

## 2021-08-27 ENCOUNTER — Other Ambulatory Visit: Payer: Self-pay

## 2021-08-27 ENCOUNTER — Encounter (HOSPITAL_COMMUNITY)
Admission: RE | Disposition: A | Discharge status: Home / Self Care | Payer: Self-pay | Source: Ambulatory Visit | Attending: Cardiovascular Disease

## 2021-08-27 ENCOUNTER — Ambulatory Visit (HOSPITAL_COMMUNITY)
Admission: RE | Admit: 2021-08-27 | Discharge: 2021-08-27 | Disposition: A | Discharge status: Home / Self Care | Payer: BC Managed Care – PPO | Source: Ambulatory Visit | Attending: Cardiovascular Disease | Admitting: Cardiovascular Disease

## 2021-08-27 DIAGNOSIS — I2510 Atherosclerotic heart disease of native coronary artery without angina pectoris: Secondary | ICD-10-CM

## 2021-08-27 DIAGNOSIS — I25119 Atherosclerotic heart disease of native coronary artery with unspecified angina pectoris: Secondary | ICD-10-CM

## 2021-08-27 DIAGNOSIS — E782 Mixed hyperlipidemia: Secondary | ICD-10-CM | POA: Insufficient documentation

## 2021-08-27 DIAGNOSIS — F17210 Nicotine dependence, cigarettes, uncomplicated: Secondary | ICD-10-CM | POA: Diagnosis not present

## 2021-08-27 DIAGNOSIS — I10 Essential (primary) hypertension: Secondary | ICD-10-CM | POA: Diagnosis not present

## 2021-08-27 DIAGNOSIS — I209 Angina pectoris, unspecified: Secondary | ICD-10-CM

## 2021-08-27 HISTORY — PX: LEFT HEART CATH AND CORONARY ANGIOGRAPHY: CATH118249

## 2021-08-27 SURGERY — LEFT HEART CATH AND CORONARY ANGIOGRAPHY
Anesthesia: LOCAL

## 2021-08-27 MED ORDER — MIDAZOLAM HCL 2 MG/2ML IJ SOLN
INTRAMUSCULAR | Status: AC
  Filled 2021-08-27: qty 2

## 2021-08-27 MED ORDER — VERAPAMIL HCL 2.5 MG/ML IV SOLN
INTRAVENOUS | Status: DC | PRN
  Administered 2021-08-27: 10 mL via INTRA_ARTERIAL

## 2021-08-27 MED ORDER — LABETALOL HCL 5 MG/ML IV SOLN: 10.0000 mg | INTRAVENOUS | Status: DC | PRN

## 2021-08-27 MED ORDER — ACETAMINOPHEN 325 MG PO TABS: 650.0000 mg | ORAL_TABLET | ORAL | Status: DC | PRN

## 2021-08-27 MED ORDER — FENTANYL CITRATE (PF) 100 MCG/2ML IJ SOLN
INTRAMUSCULAR | Status: AC
  Filled 2021-08-27: qty 2

## 2021-08-27 MED ORDER — SODIUM CHLORIDE 0.9% FLUSH: 3.0000 mL | Freq: Two times a day (BID) | INTRAVENOUS | Status: DC

## 2021-08-27 MED ORDER — FENTANYL CITRATE (PF) 100 MCG/2ML IJ SOLN
INTRAMUSCULAR | Status: DC | PRN
  Administered 2021-08-27 (×2): 25 ug via INTRAVENOUS

## 2021-08-27 MED ORDER — LIDOCAINE HCL (PF) 1 % IJ SOLN
INTRAMUSCULAR | Status: DC | PRN
  Administered 2021-08-27: 2 mL

## 2021-08-27 MED ORDER — MIDAZOLAM HCL 2 MG/2ML IJ SOLN
INTRAMUSCULAR | Status: DC | PRN
  Administered 2021-08-27: 2 mg via INTRAVENOUS
  Administered 2021-08-27: 1 mg via INTRAVENOUS

## 2021-08-27 MED ORDER — SODIUM CHLORIDE 0.9% FLUSH: 3.0000 mL | INTRAVENOUS | Status: DC | PRN

## 2021-08-27 MED ORDER — IOHEXOL 350 MG/ML SOLN
INTRAVENOUS | Status: DC | PRN
  Administered 2021-08-27: 30 mL

## 2021-08-27 MED ORDER — HYDRALAZINE HCL 20 MG/ML IJ SOLN: 10.0000 mg | INTRAMUSCULAR | Status: DC | PRN

## 2021-08-27 MED ORDER — ASPIRIN 81 MG PO CHEW: 81.0000 mg | CHEWABLE_TABLET | ORAL | Status: DC

## 2021-08-27 MED ORDER — HEPARIN (PORCINE) IN NACL 1000-0.9 UT/500ML-% IV SOLN
INTRAVENOUS | Status: DC | PRN
  Administered 2021-08-27 (×2): 500 mL

## 2021-08-27 MED ORDER — SODIUM CHLORIDE 0.9 % WEIGHT BASED INFUSION: 1.0000 mL/kg/h | INTRAVENOUS | Status: DC

## 2021-08-27 MED ORDER — ONDANSETRON HCL 4 MG/2ML IJ SOLN: 4.0000 mg | Freq: Four times a day (QID) | INTRAMUSCULAR | Status: DC | PRN

## 2021-08-27 MED ORDER — SODIUM CHLORIDE 0.9 % WEIGHT BASED INFUSION: 3.0000 mL/kg/h | INTRAVENOUS | Status: AC

## 2021-08-27 MED ORDER — SODIUM CHLORIDE 0.9 % IV SOLN: 250.0000 mL | INTRAVENOUS | Status: DC | PRN

## 2021-08-27 MED ORDER — HEPARIN SODIUM (PORCINE) 1000 UNIT/ML IJ SOLN
INTRAMUSCULAR | Status: DC | PRN
  Administered 2021-08-27: 4000 [IU] via INTRAVENOUS

## 2021-08-27 SURGICAL SUPPLY — 11 items
BAND ZEPHYR COMPRESS 30 LONG (HEMOSTASIS) ×1 IMPLANT
CATH 5FR JL3.5 JR4 ANG PIG MP (CATHETERS) ×1 IMPLANT
CATH 5FR JL4 DIAGNOSTIC (CATHETERS) ×1 IMPLANT
GLIDESHEATH SLEND SS 6F .021 (SHEATH) ×1 IMPLANT
GUIDEWIRE INQWIRE 1.5J.035X260 (WIRE) IMPLANT
INQWIRE 1.5J .035X260CM (WIRE) ×2
KIT HEART LEFT (KITS) ×2 IMPLANT
PACK CARDIAC CATHETERIZATION (CUSTOM PROCEDURE TRAY) ×2 IMPLANT
SYR MEDRAD MARK 7 150ML (SYRINGE) ×2 IMPLANT
TRANSDUCER W/STOPCOCK (MISCELLANEOUS) ×2 IMPLANT
TUBING CIL FLEX 10 FLL-RA (TUBING) ×2 IMPLANT

## 2021-08-27 NOTE — Interval H&P Note (Signed)
History and Physical Interval Note:  08/27/2021 8:54 AM  Kathryn Holmes  has presented today for surgery, with the diagnosis of angina, abnormal cardia ct.  The various methods of treatment have been discussed with the patient and family. After consideration of risks, benefits and other options for treatment, the patient has consented to  Procedure(s): LEFT HEART CATH AND CORONARY ANGIOGRAPHY (N/A) as a surgical intervention.  The patient's history has been reviewed, patient examined, no change in status, stable for surgery.  I have reviewed the patient's chart and labs.  Questions were answered to the patient's satisfaction.     Tonny Bollman

## 2022-02-16 ENCOUNTER — Telehealth: Payer: Self-pay | Admitting: Cardiology

## 2022-02-16 NOTE — Telephone Encounter (Signed)
error 

## 2022-02-21 ENCOUNTER — Telehealth: Payer: Self-pay | Admitting: Cardiology

## 2022-02-21 NOTE — Telephone Encounter (Signed)
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 She is requesting her last check up summary be sent to [phone] if he does not have the form. She reports this has to be done within 7-10 business days before they are suspended.

## 2022-02-22 NOTE — Telephone Encounter (Signed)
Spoke with pt. Agreed to come in for follow up after cath and for clearance. Sent to front desk to schedule

## 2022-02-25 ENCOUNTER — Ambulatory Visit: Payer: BC Managed Care – PPO | Attending: Cardiology | Admitting: Cardiology

## 2022-02-25 ENCOUNTER — Encounter: Payer: Self-pay | Admitting: Cardiology

## 2022-02-25 ENCOUNTER — Telehealth: Payer: Self-pay | Admitting: *Deleted

## 2022-02-25 VITALS — BP 138/82 | HR 66 | Ht 64.0 in | Wt 155.4 lb

## 2022-02-25 DIAGNOSIS — Z8679 Personal history of other diseases of the circulatory system: Secondary | ICD-10-CM | POA: Diagnosis not present

## 2022-02-25 DIAGNOSIS — F17210 Nicotine dependence, cigarettes, uncomplicated: Secondary | ICD-10-CM

## 2022-02-25 DIAGNOSIS — I2510 Atherosclerotic heart disease of native coronary artery without angina pectoris: Secondary | ICD-10-CM

## 2022-02-25 DIAGNOSIS — E782 Mixed hyperlipidemia: Secondary | ICD-10-CM | POA: Diagnosis not present

## 2022-02-25 DIAGNOSIS — I10 Essential (primary) hypertension: Secondary | ICD-10-CM

## 2022-02-25 NOTE — Progress Notes (Signed)
Cardiology Office Note:    Date:  02/25/2022   ID:  Kathryn Holmes, DOB 12-18-1965, MRN [id number]  PCP:  Nicholos Johns, MD  Cardiologist:  Jenean Lindau, MD   Referring MD: Nicholos Johns, MD    ASSESSMENT:    1. Coronary artery disease involving native coronary artery of native heart without angina pectoris   2. Essential hypertension   3. History of cardiomyopathy   4. Mixed hyperlipidemia    PLAN:    In order of problems listed above:  Coronary artery disease: Coronary angiography report as discussed below.  I discussed the report with her at extensive length and questions were answered to her satisfaction.  She was advised to walk at least half an hour a day 5 days a week and she promises to do so.  She had a form that she brought along with her.  I reviewed cardiovascular component from my side. Essential hypertension: Blood pressure is stable and diet was emphasized. Mixed dyslipidemia: On lipid-lowering medications.  Reviewed lab work done recently by primary care.  I emphasized diet and exercise.  Low-cholesterol diet sheet was given.  She will be back in 2 months for a follow-up Chem-7 liver lipid check. Cigarette smoker: I spent 5 minutes with the patient discussing solely about smoking. Smoking cessation was counseled. I suggested to the patient also different medications and pharmacological interventions. Patient is keen to try stopping on its own at this time. He will get back to me if he needs any further assistance in this matter. Patient will be seen in follow-up appointment in 6 months or earlier if the patient has any concerns    Medication Adjustments/Labs and Tests Ordered: Current medicines are reviewed at length with the patient today.  Concerns regarding medicines are outlined above.  No orders of the defined types were placed in this encounter.  No orders of the defined types were placed in this encounter.    No chief complaint on file.    History of  Present Illness:    Kathryn Holmes is a 57 y.o. female.  Patient has past medical history of coronary artery disease by coronary angiography.  Details are mentioned below. she denies any chest pain orthopnea or PND.  She takes care of activities of daily living but does not exercise on a regular basis.  Unfortunately she continues to smoke.  She has history of essential hypertension and dyslipidemia.  Past Medical History:  Diagnosis Date   Allergic rhinitis    Angina pectoris ([place]) 08/25/2021   Anxiety and depression    Attention and concentration deficit 07/28/2021   CAD (coronary artery disease) 08/25/2021   Candidal vulvovaginitis 07/28/2021   Chest discomfort 12/07/2018   Chronic fatigue, unspecified 07/28/2021   Continuous dependence on cigarette smoking 12/26/2016   DOE (dyspnea on exertion) 12/07/2018   Elevated LFTs    Essential hypertension    GERD (gastroesophageal reflux disease) 12/23/2016   History of cardiomyopathy 12/26/2016   Hyperlipidemia 12/23/2016   Ischemic heart disease    Lumbar pain 10/03/2018   Mixed hyperlipidemia 07/28/2021   Moderate recurrent major depression ([place]) 07/28/2021   Paronychia of finger of right hand 10/31/2019   Preop cardiovascular exam 07/28/2021   Preoperative cardiovascular examination 12/26/2016   Rotator cuff tear    Left shoulder   Trigger finger    Vitamin D deficiency 12/23/2016    Past Surgical History:  Procedure Laterality Date   APPENDECTOMY     LEFT HEART CATH AND  CORONARY ANGIOGRAPHY N/A 08/27/2021   Procedure: LEFT HEART CATH AND CORONARY ANGIOGRAPHY;  Surgeon: Sherren Mocha, MD;  Location: [facility];  Service: Cardiovascular;  Laterality: N/A;    Current Medications: Current Meds  Medication Sig   albuterol (VENTOLIN HFA) 108 (90 Base) MCG/ACT inhaler Inhale 1 puff into the lungs every 6 (six) hours as needed for wheezing or shortness of breath.   aspirin EC 81 MG tablet Take 81 mg by mouth daily.    atorvastatin (LIPITOR) 80 MG tablet Take 80 mg by mouth at bedtime.   cyanocobalamin (,VITAMIN B-12,) 1000 MCG/ML injection Inject 1,000 mcg into the muscle every 14 (fourteen) days.   ergocalciferol (VITAMIN D2) 1.25 MG (50000 UT) capsule Take 50,000 Units by mouth once a week.   ezetimibe (ZETIA) 10 MG tablet Take 10 mg by mouth daily.   fluticasone (FLONASE) 50 MCG/ACT nasal spray Place 1 spray into both nostrils daily as needed for rhinitis or allergies.   ibuprofen (ADVIL) 800 MG tablet Take 800 mg by mouth 3 (three) times daily as needed for pain.   lisinopril-hydrochlorothiazide (ZESTORETIC) 10-12.5 MG tablet Take 1 tablet by mouth daily.   metoprolol succinate (TOPROL-XL) 50 MG 24 hr tablet Take 50 mg by mouth daily. Take with or immediately following a meal.   montelukast (SINGULAIR) 10 MG tablet Take 10 mg by mouth at bedtime.   Multiple Vitamin (MULTIVITAMIN) capsule Take 1 capsule by mouth daily.   nitroGLYCERIN (NITROSTAT) 0.4 MG SL tablet Place 0.4 mg under the tongue every 5 (five) minutes as needed for chest pain.   Omega-3 Fatty Acids (FISH OIL) 1000 MG CAPS Take 1,000 mg by mouth daily.   omeprazole (PRILOSEC) 40 MG capsule Take 40 mg by mouth every morning.   promethazine (PHENERGAN) 12.5 MG tablet Take 12.5 mg by mouth every 6 (six) hours as needed for nausea/vomiting.   sertraline (ZOLOFT) 100 MG tablet Take 100 mg by mouth daily.   sulfamethoxazole-trimethoprim (BACTRIM DS) 800-160 MG tablet Take 1 tablet by mouth 2 (two) times daily.   traMADol (ULTRAM) 50 MG tablet Take 50 mg by mouth daily as needed for pain.     Allergies:   Patient has no known allergies.   Social History   Socioeconomic History   Marital status: Married    Spouse name: Not on file   Number of children: Not on file   Years of education: Not on file   Highest education level: Not on file  Occupational History   Not on file  Tobacco Use   Smoking status: Every Day   Smokeless tobacco: Never   Substance and Sexual Activity   Alcohol use: No   Drug use: No   Sexual activity: Not on file  Other Topics Concern   Not on file  Social History Narrative   Not on file   Social Determinants of Health   Financial Resource Strain: Not on file  Food Insecurity: Not on file  Transportation Needs: Not on file  Physical Activity: Not on file  Stress: Not on file  Social Connections: Not on file     Family History: The patient's family history includes Heart attack in her father; Hypercholesterolemia in her father; Hypertension in her brother, mother, and sister.  ROS:   Please see the history of present illness.    All other systems reviewed and are negative.  EKGs/Labs/Other Studies Reviewed:    The following studies were reviewed today: Panel Physicians Referring Physician Case Authorizing Physician  Sherren Mocha, MD (Primary)     Procedures  LEFT HEART CATH AND CORONARY ANGIOGRAPHY   Conclusion      Dist LM lesion is 30% stenosed.   Mid LAD lesion is 50% stenosed.   Prox Cx to Mid Cx lesion is 40% stenosed.   1.  Mild plaque in the distal left main with 25 to 30% stenosis 2.  Mild diffuse plaquing throughout the proximal to mid LAD with 40 to 50% stenosis in the most significant area of the mid vessel.  The LAD reaches and wraps around the left ventricular apex. 3.  Mild 40% stenosis of the mid circumflex extending into the first OM 4.  Mild diffuse nonobstructive plaquing throughout the RCA (dominant vessel)  The patient has diffuse nonobstructive coronary artery disease.  Recommend aggressive medical therapy for risk reduction.  Cardiac catheterization findings are consistent with her coronary CTA-FFR which demonstrated no hemodynamically significant lesions.   Recent Labs: 08/25/2021: BUN 14; Creatinine, Ser 1.02; Hemoglobin 14.6; Platelets 381; Potassium 4.6; Sodium 139  Recent Lipid Panel No results found for: "CHOL", "TRIG", "HDL", "CHOLHDL", "VLDL",  "LDLCALC", "LDLDIRECT"  Physical Exam:    VS:  BP 138/82   Pulse 66   Ht 5\' 4"  (1.626 m)   Wt 155 lb 6.4 oz (70.5 kg)   SpO2 98%   BMI 26.67 kg/m     Wt Readings from Last 3 Encounters:  02/25/22 155 lb 6.4 oz (70.5 kg)  08/27/21 156 lb (70.8 kg)  08/25/21 151 lb 3.2 oz (68.6 kg)     GEN: Patient is in no acute distress HEENT: Normal NECK: No JVD; No carotid bruits LYMPHATICS: No lymphadenopathy CARDIAC: Hear sounds regular, 2/6 systolic murmur at the apex. RESPIRATORY:  Clear to auscultation without rales, wheezing or rhonchi  ABDOMEN: Soft, non-tender, non-distended MUSCULOSKELETAL:  No edema; No deformity  SKIN: Warm and dry NEUROLOGIC:  Alert and oriented x 3 PSYCHIATRIC:  Normal affect   Signed, Jenean Lindau, MD  02/25/2022 8:25 AM    [facility]

## 2022-02-25 NOTE — Telephone Encounter (Signed)
Faxed form for pt to get her CDL's

## 2022-02-25 NOTE — Patient Instructions (Addendum)
Medication Instructions:  Your physician recommends that you continue on your current medications as directed. Please refer to the Current Medication list given to you today.  *If you need a refill on your cardiac medications before your next appointment, please call your pharmacy*   Lab Work: BMET, LFT, Lipids in 2 months If you have labs (blood work) drawn today and your tests are completely normal, you will receive your results only by: [place] (if you have MyChart) OR A paper copy in the mail If you have any lab test that is abnormal or we need to change your treatment, we will call you to review the results.   Testing/Procedures: None   Follow-Up: At [facility], you and your health needs are our priority.  As part of our continuing mission to provide you with exceptional heart care, we have created designated Provider Care Teams.  These Care Teams include your primary Cardiologist (physician) and Advanced Practice Providers (APPs -  Physician Assistants and Nurse Practitioners) who all work together to provide you with the care you need, when you need it.  We recommend signing up for the patient portal called "MyChart".  Sign up information is provided on this After Visit Summary.  MyChart is used to connect with patients for Virtual Visits (Telemedicine).  Patients are able to view lab/test results, encounter notes, upcoming appointments, etc.  Non-urgent messages can be sent to your provider as well.   To learn more about what you can do with MyChart, go to [email or web address].    Your next appointment:   9 month(s)  Provider:   Jyl Heinz, MD    Other Instructions  Preventing High Cholesterol Cholesterol is a white, waxy substance similar to fat that the human body needs to help build cells. The liver makes all the cholesterol that a person's body needs. Having high cholesterol (hypercholesterolemia) increases your risk for heart disease and stroke.  Extra or excess cholesterol comes from the food that you eat. High cholesterol can often be prevented with diet and lifestyle changes. If you already have high cholesterol, you can control it with diet, lifestyle changes, and medicines. How can high cholesterol affect me? If you have high cholesterol, fatty deposits (plaques) may build up on the walls of your blood vessels. The blood vessels that carry blood away from your heart are called arteries. Plaques make the arteries narrower and stiffer. This in turn can: Restrict or block blood flow and cause blood clots to form. Increase your risk for heart attack and stroke. What can increase my risk for high cholesterol? This condition is more likely to develop in people who: Eat foods that are high in saturated fat or cholesterol. Saturated fat is mostly found in foods that come from animal sources. Are overweight. Are not getting enough exercise. Use products that contain nicotine or tobacco, such as cigarettes, e-cigarettes, and chewing tobacco. Have a family history of high cholesterol (familial hypercholesterolemia). What actions can I take to prevent this? Nutrition  Eat less saturated fat. Avoid trans fats (partially hydrogenated oils). These are often found in margarine and in some baked goods, fried foods, and snacks bought in packages. Avoid precooked or cured meat, such as bacon, sausages, or meat loaves. Avoid foods and drinks that have added sugars. Eat more fruits, vegetables, and whole grains. Choose healthy sources of protein, such as fish, poultry, lean cuts of red meat, beans, peas, lentils, and nuts. Choose healthy sources of fat, such as: Nuts. Vegetable oils, especially  olive oil. Fish that have healthy fats, such as omega-3 fatty acids. These fish include mackerel or salmon. Lifestyle Lose weight if you are overweight. Maintaining a healthy body mass index (BMI) can help prevent or control high cholesterol. It can also  lower your risk for diabetes and high blood pressure. Ask your health care provider to help you with a diet and exercise plan to lose weight safely. Do not use any products that contain nicotine or tobacco. These products include cigarettes, chewing tobacco, and vaping devices, such as e-cigarettes. If you need help quitting, ask your health care provider. Alcohol use Do not drink alcohol if: Your health care provider tells you not to drink. You are pregnant, may be pregnant, or are planning to become pregnant. If you drink alcohol: Limit how much you have to: 0-1 drink a day for women. 0-2 drinks a day for men. Know how much alcohol is in your drink. In the [place] one drink equals one 12 oz bottle of beer (355 mL), one 5 oz glass of wine (148 mL), or one 1 oz glass of hard liquor (44 mL). Activity  Get enough exercise. Do exercises as told by your health care provider. Each week, do at least 150 minutes of exercise that takes a medium level of effort (moderate-intensity exercise). This kind of exercise: Makes your heart beat faster while allowing you to still be able to talk. Can be done in short sessions several times a day or longer sessions a few times a week. For example, on 5 days each week, you could walk fast or ride your bike 3 times a day for 10 minutes each time. Medicines Your health care provider may recommend medicines to help lower cholesterol. This may be a medicine to lower the amount of cholesterol that your liver makes. You may need medicine if: Diet and lifestyle changes have not lowered your cholesterol enough. You have high cholesterol and other risk factors for heart disease or stroke. Take over-the-counter and prescription medicines only as told by your health care provider. General information Manage your risk factors for high cholesterol. Talk with your health care provider about all your risk factors and how to lower your risk. Manage other conditions that you have,  such as diabetes or high blood pressure (hypertension). Have blood tests to check your cholesterol levels at regular points in time as told by your health care provider. Keep all follow-up visits. This is important. Where to find more information American Heart Association: www.heart.org National Heart, Lung, and Blood Institute: https://wilson-eaton.com/ Summary High cholesterol increases your risk for heart disease and stroke. By keeping your cholesterol level low, you can reduce your risk for these conditions. High cholesterol can often be prevented with diet and lifestyle changes. Work with your health care provider to manage your risk factors, and have your blood tested regularly. This information is not intended to replace advice given to you by your health care provider. Make sure you discuss any questions you have with your health care provider. Document Revised: 08/13/2021 Document Reviewed: 03/16/2020 Elsevier Patient Education  [place].

## 2022-10-05 ENCOUNTER — Encounter: Payer: Self-pay | Admitting: Cardiology

## 2022-10-05 ENCOUNTER — Ambulatory Visit: Payer: Medicare Other | Attending: Cardiology | Admitting: Cardiology

## 2022-10-05 VITALS — BP 116/70 | HR 63 | Ht 64.0 in | Wt 143.6 lb

## 2022-10-05 DIAGNOSIS — E782 Mixed hyperlipidemia: Secondary | ICD-10-CM

## 2022-10-05 DIAGNOSIS — I10 Essential (primary) hypertension: Secondary | ICD-10-CM

## 2022-10-05 DIAGNOSIS — Z8679 Personal history of other diseases of the circulatory system: Secondary | ICD-10-CM | POA: Diagnosis not present

## 2022-10-05 DIAGNOSIS — F17210 Nicotine dependence, cigarettes, uncomplicated: Secondary | ICD-10-CM | POA: Diagnosis not present

## 2022-10-05 DIAGNOSIS — I2510 Atherosclerotic heart disease of native coronary artery without angina pectoris: Secondary | ICD-10-CM

## 2022-10-05 NOTE — Patient Instructions (Signed)

## 2022-10-05 NOTE — Progress Notes (Signed)
Cardiology Office Note:    Date:  10/05/2022   ID:  [name], DOB 1966/01/12, MRN [id number]  PCP:  Lucianne Lei, MD  Cardiologist:  Garwin Brothers, MD   Referring MD: Lucianne Lei, MD    ASSESSMENT:    1. Coronary artery disease involving native coronary artery of native heart without angina pectoris   2. Essential hypertension   3. History of cardiomyopathy   4. Mixed hyperlipidemia    PLAN:    In order of problems listed above:  Coronary artery disease: Secondary prevention stressed to the patient.  Importance of compliance with diet medication stressed and she verbalized understanding.  She is advised to walk at least 2 miles a day on a regular basis.  She promises to do so. Essential hypertension: Blood pressure stable and diet was emphasized.  Salt intake issues were discussed. Mixed dyslipidemia: On lipid-lowering medications.  Lipids are not at goal and I counseled her about this.  She is promising to make better with diet and does not want any increasing medications.  Goal LDL must be less than 60. Cigarette smoking: I spent 5 minutes with the patient discussing solely about smoking. Smoking cessation was counseled. I suggested to the patient also different medications and pharmacological interventions. Patient is keen to try stopping on its own at this time. He will get back to me if he needs any further assistance in this matter. Patient will be seen in follow-up appointment in 6 months or earlier if the patient has any concerns.  She brought in a form and I failed in the cardiovascular component for that form.   Medication Adjustments/Labs and Tests Ordered: Current medicines are reviewed at length with the patient today.  Concerns regarding medicines are outlined above.  Orders Placed This Encounter  Procedures   EKG 12-Lead   No orders of the defined types were placed in this encounter.    No chief complaint on file.    History of Present Illness:     Kathryn Holmes is a 57 y.o. female.  Patient has past medical history of coronary artery disease, essential hypertension, dyslipidemia and unfortunately continues to smoke.  She denies any problems at this time and takes care of activities of daily living.  No chest pain orthopnea or PND.  At the time of my evaluation, the patient is alert awake oriented and in no distress.  Overall she leads a sedentary lifestyle.  Unfortunately she continues to smoke.  She tells me that she smokes 6 to 7 cigarettes a day.  Past Medical History:  Diagnosis Date   Allergic rhinitis    Angina pectoris ([facility]) 08/25/2021   Anxiety and depression    Attention and concentration deficit 07/28/2021   CAD (coronary artery disease) 08/25/2021   Candidal vulvovaginitis 07/28/2021   Chest discomfort 12/07/2018   Chronic fatigue, unspecified 07/28/2021   Continuous dependence on cigarette smoking 12/26/2016   DOE (dyspnea on exertion) 12/07/2018   Elevated LFTs    Essential hypertension    GERD (gastroesophageal reflux disease) 12/23/2016   History of cardiomyopathy 12/26/2016   Hyperlipidemia 12/23/2016   Ischemic heart disease    Lumbar pain 10/03/2018   Mixed hyperlipidemia 07/28/2021   Moderate recurrent major depression ([facility]) 07/28/2021   Paronychia of finger of right hand 10/31/2019   Preop cardiovascular exam 07/28/2021   Preoperative cardiovascular examination 12/26/2016   Rotator cuff tear    Left shoulder   Trigger finger    Vitamin D deficiency 12/23/2016  Past Surgical History:  Procedure Laterality Date   APPENDECTOMY     LEFT HEART CATH AND CORONARY ANGIOGRAPHY N/A 08/27/2021   Procedure: LEFT HEART CATH AND CORONARY ANGIOGRAPHY;  Surgeon: Tonny Bollman, MD;  Location: [facility];  Service: Cardiovascular;  Laterality: N/A;    Current Medications: Current Meds  Medication Sig   albuterol (VENTOLIN HFA) 108 (90 Base) MCG/ACT inhaler Inhale 1 puff into the lungs every 6 (six)  hours as needed for wheezing or shortness of breath.   aspirin EC 81 MG tablet Take 81 mg by mouth daily.   atorvastatin (LIPITOR) 80 MG tablet Take 80 mg by mouth at bedtime.   cyanocobalamin (,VITAMIN B-12,) 1000 MCG/ML injection Inject 1,000 mcg into the muscle every 14 (fourteen) days.   ergocalciferol (VITAMIN D2) 1.25 MG (50000 UT) capsule Take 50,000 Units by mouth once a week.   ezetimibe (ZETIA) 10 MG tablet Take 10 mg by mouth daily.   fluticasone (FLONASE) 50 MCG/ACT nasal spray Place 1 spray into both nostrils daily as needed for rhinitis or allergies.   ibuprofen (ADVIL) 800 MG tablet Take 800 mg by mouth 3 (three) times daily as needed for pain.   lisinopril-hydrochlorothiazide (ZESTORETIC) 10-12.5 MG tablet Take 1 tablet by mouth daily.   meloxicam (MOBIC) 15 MG tablet Take 15 mg by mouth daily as needed for pain.   metoprolol succinate (TOPROL-XL) 50 MG 24 hr tablet Take 50 mg by mouth daily. Take with or immediately following a meal.   montelukast (SINGULAIR) 10 MG tablet Take 10 mg by mouth at bedtime.   Multiple Vitamin (MULTIVITAMIN) capsule Take 1 capsule by mouth daily.   nitroGLYCERIN (NITROSTAT) 0.4 MG SL tablet Place 0.4 mg under the tongue every 5 (five) minutes as needed for chest pain.   Omega-3 Fatty Acids (FISH OIL) 1000 MG CAPS Take 1,000 mg by mouth daily.   omeprazole (PRILOSEC) 40 MG capsule Take 40 mg by mouth every morning.   phentermine (ADIPEX-P) 37.5 MG tablet Take 37.5 mg by mouth daily.   promethazine (PHENERGAN) 12.5 MG tablet Take 12.5 mg by mouth every 6 (six) hours as needed for nausea/vomiting.   sertraline (ZOLOFT) 100 MG tablet Take 100 mg by mouth daily.   sulfamethoxazole-trimethoprim (BACTRIM DS) 800-160 MG tablet Take 1 tablet by mouth 2 (two) times daily.   tiZANidine (ZANAFLEX) 2 MG tablet Take 2 mg by mouth 2 (two) times daily as needed for muscle spasms.   traMADol (ULTRAM) 50 MG tablet Take 50 mg by mouth daily as needed for pain.      Allergies:   Patient has no known allergies.   Social History   Socioeconomic History   Marital status: Married    Spouse name: Not on file   Number of children: Not on file   Years of education: Not on file   Highest education level: Not on file  Occupational History   Not on file  Tobacco Use   Smoking status: Every Day   Smokeless tobacco: Never  Substance and Sexual Activity   Alcohol use: No   Drug use: No   Sexual activity: Not on file  Other Topics Concern   Not on file  Social History Narrative   Not on file   Social Determinants of Health   Financial Resource Strain: Not on file  Food Insecurity: Not on file  Transportation Needs: Not on file  Physical Activity: Not on file  Stress: Not on file  Social Connections: Not on file  Family History: The patient's family history includes Heart attack in her father; Hypercholesterolemia in her father; Hypertension in her brother, mother, and sister.  ROS:   Please see the history of present illness.    All other systems reviewed and are negative.  EKGs/Labs/Other Studies Reviewed:    The following studies were reviewed today: .Marland KitchenEKG Interpretation Date/Time:  Wednesday October 05 2022 11:09:30 EDT Ventricular Rate:  63 PR Interval:  176 QRS Duration:  84 QT Interval:  406 QTC Calculation: 415 R Axis:   88  Text Interpretation: Normal sinus rhythm Low voltage QRS Borderline ECG When compared with ECG of 17-Mar-2017 12:37, No significant change was found Confirmed by Belva Crome [phone]) on 10/05/2022 11:25:19 AM     Recent Labs: No results found for requested labs within last 365 days.  Recent Lipid Panel No results found for: "CHOL", "TRIG", "HDL", "CHOLHDL", "VLDL", "LDLCALC", "LDLDIRECT"  Physical Exam:    VS:  BP 116/70   Pulse 63   Ht 5\' 4"  (1.626 m)   Wt 143 lb 9.6 oz (65.1 kg)   SpO2 96%   BMI 24.65 kg/m     Wt Readings from Last 3 Encounters:  10/05/22 143 lb 9.6 oz (65.1 kg)   02/25/22 155 lb 6.4 oz (70.5 kg)  08/27/21 156 lb (70.8 kg)     GEN: Patient is in no acute distress HEENT: Normal NECK: No JVD; No carotid bruits LYMPHATICS: No lymphadenopathy CARDIAC: Hear sounds regular, 2/6 systolic murmur at the apex. RESPIRATORY:  Clear to auscultation without rales, wheezing or rhonchi  ABDOMEN: Soft, non-tender, non-distended MUSCULOSKELETAL:  No edema; No deformity  SKIN: Warm and dry NEUROLOGIC:  Alert and oriented x 3 PSYCHIATRIC:  Normal affect   Signed, Garwin Brothers, MD  10/05/2022 11:28 AM    [facility]

## 2022-10-10 ENCOUNTER — Ambulatory Visit: Payer: BC Managed Care – PPO | Admitting: Cardiology

## 2023-04-18 DIAGNOSIS — Z8709 Personal history of other diseases of the respiratory system: Secondary | ICD-10-CM | POA: Diagnosis not present

## 2023-04-18 DIAGNOSIS — I10 Essential (primary) hypertension: Secondary | ICD-10-CM | POA: Diagnosis not present

## 2023-04-18 DIAGNOSIS — J309 Allergic rhinitis, unspecified: Secondary | ICD-10-CM | POA: Diagnosis not present

## 2023-09-19 DIAGNOSIS — H16143 Punctate keratitis, bilateral: Secondary | ICD-10-CM | POA: Diagnosis not present

## 2023-09-21 ENCOUNTER — Telehealth: Payer: Self-pay | Admitting: Cardiology

## 2023-09-21 NOTE — Telephone Encounter (Signed)
 Patient c/o Palpitations: STAT if patient c/o lightheadedness, shortness of breath, or chest pain  How long have you had palpitations/irregular HR/ Afib? Are you having the symptoms now? Palpitations off and on but last felt it at 6am, no  Are you currently experiencing lightheadedness, SOB or CP? No  Do you have a history of afib (atrial fibrillation) or irregular heart rhythm? No  Have you checked your BP or HR? (document readings if available): No  Are you experiencing any other symptoms? No

## 2023-09-22 NOTE — Telephone Encounter (Signed)
 Left vm to return call.

## 2023-09-26 DIAGNOSIS — Z0000 Encounter for general adult medical examination without abnormal findings: Secondary | ICD-10-CM | POA: Diagnosis not present

## 2023-09-26 DIAGNOSIS — Z6826 Body mass index (BMI) 26.0-26.9, adult: Secondary | ICD-10-CM | POA: Diagnosis not present

## 2023-09-26 DIAGNOSIS — R87610 Atypical squamous cells of undetermined significance on cytologic smear of cervix (ASC-US): Secondary | ICD-10-CM | POA: Diagnosis not present

## 2023-09-26 DIAGNOSIS — Z124 Encounter for screening for malignant neoplasm of cervix: Secondary | ICD-10-CM | POA: Diagnosis not present

## 2023-09-26 DIAGNOSIS — Z1212 Encounter for screening for malignant neoplasm of rectum: Secondary | ICD-10-CM | POA: Diagnosis not present

## 2023-09-26 DIAGNOSIS — E559 Vitamin D deficiency, unspecified: Secondary | ICD-10-CM | POA: Diagnosis not present

## 2023-09-26 NOTE — Telephone Encounter (Signed)
 Left vm to return call.

## 2023-10-30 DIAGNOSIS — R87610 Atypical squamous cells of undetermined significance on cytologic smear of cervix (ASC-US): Secondary | ICD-10-CM | POA: Diagnosis not present

## 2024-02-06 ENCOUNTER — Other Ambulatory Visit: Payer: Self-pay

## 2024-02-09 ENCOUNTER — Ambulatory Visit: Attending: Cardiology | Admitting: Cardiology
